# Patient Record
Sex: Male | Born: 1980 | Race: Black or African American | Hispanic: No | Marital: Married | Smoking: Never smoker
Health system: Southern US, Community
[De-identification: ages and names within clinical notes are randomized; demographics above are authoritative.]

## PROBLEM LIST (undated history)

## (undated) DIAGNOSIS — I517 Cardiomegaly: Secondary | ICD-10-CM

## (undated) DIAGNOSIS — R740 Nonspecific elevation of levels of transaminase and lactic acid dehydrogenase [LDH]: Secondary | ICD-10-CM

## (undated) DIAGNOSIS — I213 ST elevation (STEMI) myocardial infarction of unspecified site: Secondary | ICD-10-CM

## (undated) DIAGNOSIS — R030 Elevated blood-pressure reading, without diagnosis of hypertension: Secondary | ICD-10-CM

## (undated) DIAGNOSIS — E669 Obesity, unspecified: Secondary | ICD-10-CM

## (undated) DIAGNOSIS — I309 Acute pericarditis, unspecified: Principal | ICD-10-CM

## (undated) HISTORY — DX: ST elevation (STEMI) myocardial infarction of unspecified site: I21.3

## (undated) HISTORY — DX: Cardiomegaly: I51.7

## (undated) HISTORY — DX: Nonspecific elevation of levels of transaminase and lactic acid dehydrogenase (ldh): R74.0

## (undated) HISTORY — DX: Elevated blood-pressure reading, without diagnosis of hypertension: R03.0

## (undated) HISTORY — PX: CARDIAC CATHETERIZATION: SHX172

## (undated) HISTORY — DX: Acute pericarditis, unspecified: I30.9

---

## 1999-02-21 ENCOUNTER — Encounter: Payer: Self-pay | Admitting: Emergency Medicine

## 1999-02-21 ENCOUNTER — Emergency Department (HOSPITAL_COMMUNITY): Admission: EM | Admit: 1999-02-21 | Discharge: 1999-02-21 | Payer: Self-pay | Admitting: Emergency Medicine

## 2007-09-18 ENCOUNTER — Emergency Department (HOSPITAL_COMMUNITY): Admission: EM | Admit: 2007-09-18 | Discharge: 2007-09-18 | Payer: Self-pay | Admitting: Emergency Medicine

## 2009-04-06 ENCOUNTER — Emergency Department (HOSPITAL_COMMUNITY): Admission: EM | Admit: 2009-04-06 | Discharge: 2009-04-06 | Payer: Self-pay | Admitting: Emergency Medicine

## 2009-06-28 ENCOUNTER — Emergency Department (HOSPITAL_COMMUNITY): Admission: EM | Admit: 2009-06-28 | Discharge: 2009-06-28 | Payer: Self-pay | Admitting: Family Medicine

## 2009-12-17 ENCOUNTER — Emergency Department (HOSPITAL_COMMUNITY): Admission: EM | Admit: 2009-12-17 | Discharge: 2009-12-17 | Payer: Self-pay | Admitting: Emergency Medicine

## 2010-03-13 ENCOUNTER — Emergency Department (HOSPITAL_COMMUNITY): Admission: EM | Admit: 2010-03-13 | Discharge: 2010-03-13 | Payer: Self-pay | Admitting: Emergency Medicine

## 2010-07-02 ENCOUNTER — Emergency Department (HOSPITAL_COMMUNITY): Admission: EM | Admit: 2010-07-02 | Discharge: 2010-07-02 | Payer: Self-pay | Admitting: Family Medicine

## 2010-08-31 ENCOUNTER — Emergency Department (HOSPITAL_COMMUNITY): Admission: EM | Admit: 2010-08-31 | Discharge: 2010-08-31 | Payer: Self-pay | Admitting: Family Medicine

## 2010-11-09 ENCOUNTER — Emergency Department (HOSPITAL_COMMUNITY)
Admission: EM | Admit: 2010-11-09 | Discharge: 2010-11-09 | Payer: Self-pay | Source: Home / Self Care | Admitting: Family Medicine

## 2011-02-20 ENCOUNTER — Inpatient Hospital Stay (INDEPENDENT_AMBULATORY_CARE_PROVIDER_SITE_OTHER)
Admission: RE | Admit: 2011-02-20 | Discharge: 2011-02-20 | Disposition: A | Discharge status: Home / Self Care | Payer: BC Managed Care – PPO | Source: Ambulatory Visit | Attending: Family Medicine | Admitting: Family Medicine

## 2011-02-20 DIAGNOSIS — S335XXA Sprain of ligaments of lumbar spine, initial encounter: Secondary | ICD-10-CM

## 2011-02-20 DIAGNOSIS — IMO0002 Reserved for concepts with insufficient information to code with codable children: Secondary | ICD-10-CM

## 2011-03-11 LAB — POCT I-STAT, CHEM 8
BUN: 13 mg/dL (ref 6–23)
Calcium, Ion: 1.23 mmol/L (ref 1.12–1.32)
Chloride: 103 mEq/L (ref 96–112)
Creatinine, Ser: 1.1 mg/dL (ref 0.4–1.5)
Glucose, Bld: 92 mg/dL (ref 70–99)
HCT: 48 % (ref 39.0–52.0)
Hemoglobin: 16.3 g/dL (ref 13.0–17.0)
Potassium: 3.8 mEq/L (ref 3.5–5.1)
Sodium: 140 mEq/L (ref 135–145)
TCO2: 27 mmol/L (ref 0–100)

## 2011-03-11 LAB — POCT URINALYSIS DIP (DEVICE)
Bilirubin Urine: NEGATIVE
Glucose, UA: NEGATIVE mg/dL
Hgb urine dipstick: NEGATIVE
Ketones, ur: NEGATIVE mg/dL
Nitrite: NEGATIVE
Protein, ur: 30 mg/dL — AB
Specific Gravity, Urine: 1.02 (ref 1.005–1.030)
Urobilinogen, UA: 1 mg/dL (ref 0.0–1.0)
pH: 7 (ref 5.0–8.0)

## 2011-03-11 LAB — CK: Total CK: 302 U/L — ABNORMAL HIGH (ref 7–232)

## 2011-06-24 ENCOUNTER — Emergency Department (HOSPITAL_COMMUNITY)
Admission: EM | Admit: 2011-06-24 | Discharge: 2011-06-24 | Disposition: A | Discharge status: Home / Self Care | Payer: Self-pay | Attending: Emergency Medicine | Admitting: Emergency Medicine

## 2011-06-24 DIAGNOSIS — R112 Nausea with vomiting, unspecified: Secondary | ICD-10-CM | POA: Insufficient documentation

## 2011-06-24 DIAGNOSIS — R4789 Other speech disturbances: Secondary | ICD-10-CM | POA: Insufficient documentation

## 2011-06-24 DIAGNOSIS — R040 Epistaxis: Secondary | ICD-10-CM | POA: Insufficient documentation

## 2011-06-24 DIAGNOSIS — E669 Obesity, unspecified: Secondary | ICD-10-CM | POA: Insufficient documentation

## 2011-06-24 DIAGNOSIS — F1010 Alcohol abuse, uncomplicated: Secondary | ICD-10-CM | POA: Insufficient documentation

## 2011-06-24 LAB — CBC
MCHC: 36.1 g/dL — ABNORMAL HIGH (ref 30.0–36.0)
Platelets: 162 10*3/uL (ref 150–400)
RDW: 12.1 % (ref 11.5–15.5)

## 2011-06-24 LAB — POCT I-STAT, CHEM 8
BUN: 11 mg/dL (ref 6–23)
Calcium, Ion: 1.15 mmol/L (ref 1.12–1.32)
Chloride: 99 mEq/L (ref 96–112)
Creatinine, Ser: 1.5 mg/dL — ABNORMAL HIGH (ref 0.50–1.35)
Glucose, Bld: 127 mg/dL — ABNORMAL HIGH (ref 70–99)
HCT: 42 % (ref 39.0–52.0)
Hemoglobin: 14.3 g/dL (ref 13.0–17.0)
Potassium: 3.8 mEq/L (ref 3.5–5.1)
Sodium: 137 mEq/L (ref 135–145)
TCO2: 25 mmol/L (ref 0–100)

## 2011-06-24 LAB — ETHANOL
Alcohol, Ethyl (B): 241 mg/dL — ABNORMAL HIGH (ref 0–11)
Alcohol, Ethyl (B): 203 mg/dL — ABNORMAL HIGH (ref 0–11)

## 2011-11-21 IMAGING — CR DG CHEST 2V
2 series · 2 of 2 positions shown · non-contrast
Comparison: None.

CLINICAL DATA: Cough, congestion, shortness of breath for 1 week

CHEST - 2 VIEW

[view not recorded (1 of 2)]
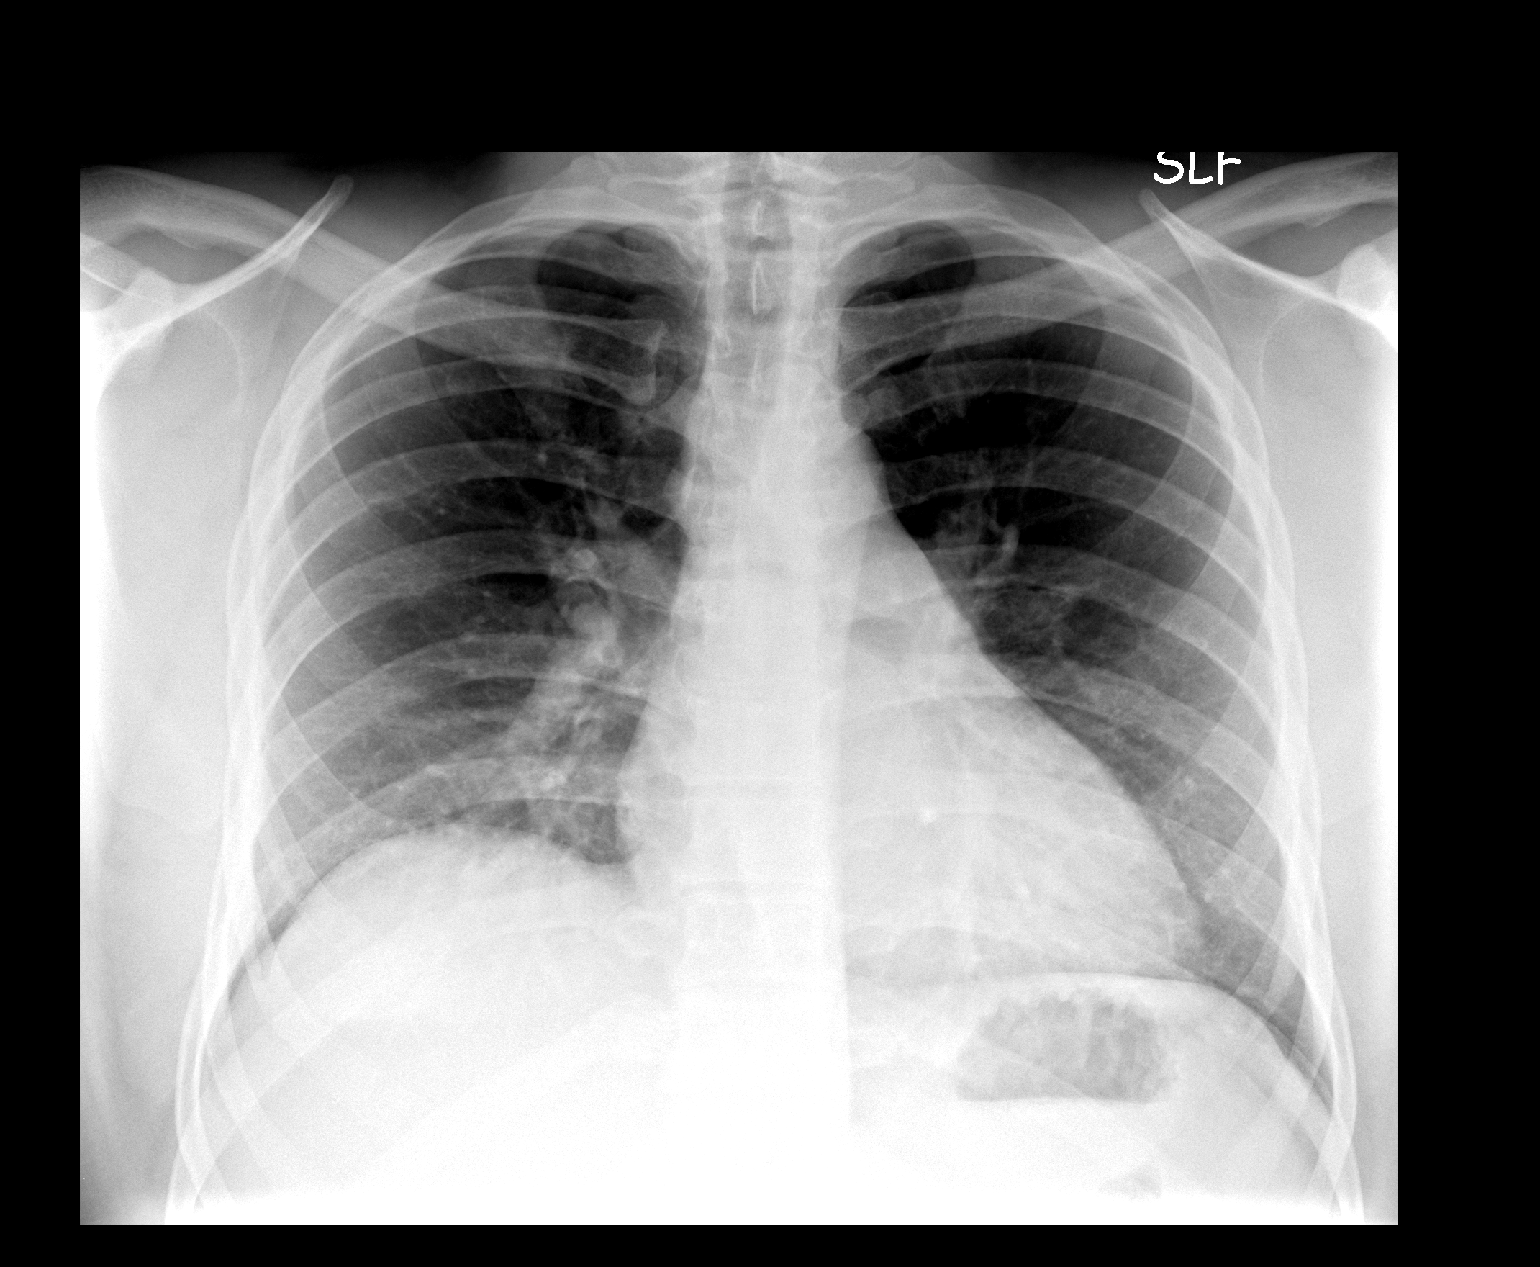

[view not recorded (2 of 2)]
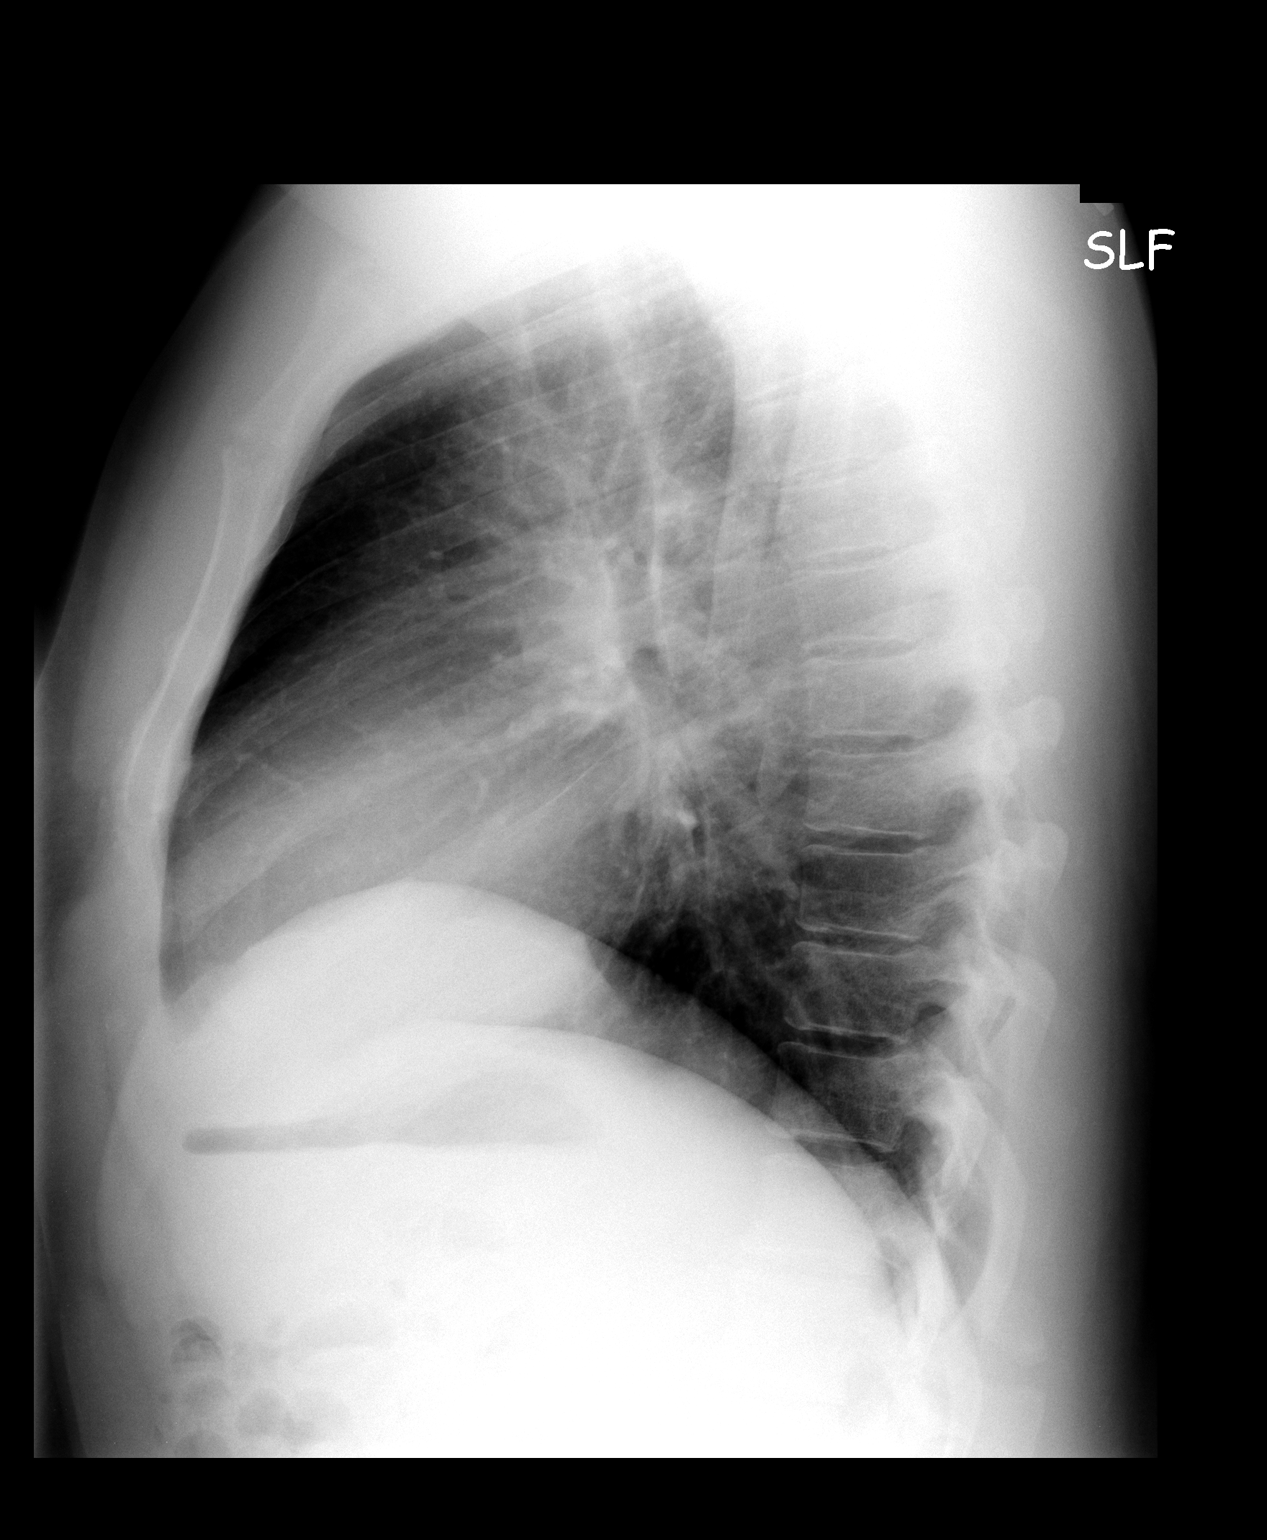

[2 of 2 positions shown; findings below may reference images not displayed]

FINDINGS: The lungs are clear.  Mediastinal contours appear normal.
The heart is within upper limits of normal.  No bony abnormality is
seen.
IMPRESSION: No active lung disease.

## 2012-09-16 ENCOUNTER — Encounter (HOSPITAL_COMMUNITY): Payer: Self-pay | Admitting: *Deleted

## 2012-09-16 ENCOUNTER — Emergency Department (HOSPITAL_COMMUNITY): Payer: BC Managed Care – PPO

## 2012-09-16 DIAGNOSIS — R0602 Shortness of breath: Secondary | ICD-10-CM | POA: Insufficient documentation

## 2012-09-16 DIAGNOSIS — R000 Tachycardia, unspecified: Secondary | ICD-10-CM | POA: Insufficient documentation

## 2012-09-16 DIAGNOSIS — R079 Chest pain, unspecified: Secondary | ICD-10-CM | POA: Insufficient documentation

## 2012-09-16 LAB — CBC WITH DIFFERENTIAL/PLATELET
Basophils Absolute: 0 10*3/uL (ref 0.0–0.1)
Basophils Relative: 0 % (ref 0–1)
HCT: 41.2 % (ref 39.0–52.0)
MCHC: 35.4 g/dL (ref 30.0–36.0)
Monocytes Absolute: 1 10*3/uL (ref 0.1–1.0)
Neutro Abs: 7.4 10*3/uL (ref 1.7–7.7)
RDW: 12.1 % (ref 11.5–15.5)

## 2012-09-16 LAB — COMPREHENSIVE METABOLIC PANEL
AST: 73 U/L — ABNORMAL HIGH (ref 0–37)
Albumin: 4.5 g/dL (ref 3.5–5.2)
Calcium: 9.9 mg/dL (ref 8.4–10.5)
Chloride: 99 mEq/L (ref 96–112)
Creatinine, Ser: 1.14 mg/dL (ref 0.50–1.35)
Total Bilirubin: 3 mg/dL — ABNORMAL HIGH (ref 0.3–1.2)

## 2012-09-16 LAB — POCT I-STAT TROPONIN I

## 2012-09-16 NOTE — ED Notes (Signed)
Pt state that he was diagnosed with inflammation to the cartilage between his ribs a while ago (a year and a half.) Pt states that yesterday he had CP, pt states hard to take a deep breath. Pt states pain constant since yesterday but started to make his teeth hurt so he got worried.

## 2012-09-17 ENCOUNTER — Emergency Department (HOSPITAL_COMMUNITY)
Admission: EM | Admit: 2012-09-17 | Discharge: 2012-09-17 | Disposition: A | Discharge status: Home Health | Payer: BC Managed Care – PPO | Attending: Emergency Medicine | Admitting: Emergency Medicine

## 2012-09-17 ENCOUNTER — Ambulatory Visit (INDEPENDENT_AMBULATORY_CARE_PROVIDER_SITE_OTHER): Payer: BC Managed Care – PPO | Admitting: Internal Medicine

## 2012-09-17 ENCOUNTER — Emergency Department (HOSPITAL_COMMUNITY): Payer: BC Managed Care – PPO

## 2012-09-17 ENCOUNTER — Other Ambulatory Visit: Payer: Self-pay

## 2012-09-17 ENCOUNTER — Encounter: Payer: Self-pay | Admitting: Internal Medicine

## 2012-09-17 ENCOUNTER — Ambulatory Visit (HOSPITAL_BASED_OUTPATIENT_CLINIC_OR_DEPARTMENT_OTHER): Payer: BC Managed Care – PPO | Admitting: Radiology

## 2012-09-17 ENCOUNTER — Encounter (HOSPITAL_COMMUNITY): Payer: Self-pay | Admitting: Radiology

## 2012-09-17 VITALS — BP 138/91 | HR 113 | Ht 70.0 in | Wt 247.4 lb

## 2012-09-17 DIAGNOSIS — I309 Acute pericarditis, unspecified: Secondary | ICD-10-CM

## 2012-09-17 DIAGNOSIS — R072 Precordial pain: Secondary | ICD-10-CM | POA: Insufficient documentation

## 2012-09-17 DIAGNOSIS — G4733 Obstructive sleep apnea (adult) (pediatric): Secondary | ICD-10-CM | POA: Insufficient documentation

## 2012-09-17 DIAGNOSIS — R079 Chest pain, unspecified: Secondary | ICD-10-CM

## 2012-09-17 DIAGNOSIS — I369 Nonrheumatic tricuspid valve disorder, unspecified: Secondary | ICD-10-CM | POA: Insufficient documentation

## 2012-09-17 DIAGNOSIS — R7401 Elevation of levels of liver transaminase levels: Secondary | ICD-10-CM

## 2012-09-17 DIAGNOSIS — IMO0001 Reserved for inherently not codable concepts without codable children: Secondary | ICD-10-CM | POA: Insufficient documentation

## 2012-09-17 DIAGNOSIS — K759 Inflammatory liver disease, unspecified: Secondary | ICD-10-CM

## 2012-09-17 DIAGNOSIS — I517 Cardiomegaly: Secondary | ICD-10-CM

## 2012-09-17 DIAGNOSIS — R030 Elevated blood-pressure reading, without diagnosis of hypertension: Secondary | ICD-10-CM

## 2012-09-17 DIAGNOSIS — I10 Essential (primary) hypertension: Secondary | ICD-10-CM | POA: Insufficient documentation

## 2012-09-17 HISTORY — DX: Acute pericarditis, unspecified: I30.9

## 2012-09-17 HISTORY — DX: Cardiomegaly: I51.7

## 2012-09-17 HISTORY — DX: Reserved for inherently not codable concepts without codable children: IMO0001

## 2012-09-17 HISTORY — DX: Elevation of levels of liver transaminase levels: R74.01

## 2012-09-17 LAB — SEDIMENTATION RATE: Sed Rate: 50 mm/hr — ABNORMAL HIGH (ref 0–22)

## 2012-09-17 MED ORDER — NAPROXEN 500 MG PO TABS: 500.0000 mg | ORAL_TABLET | Freq: Two times a day (BID) | ORAL | Status: DC

## 2012-09-17 MED ORDER — KETOROLAC TROMETHAMINE 30 MG/ML IJ SOLN
30.0000 mg | Freq: Once | INTRAMUSCULAR | Status: AC
  Administered 2012-09-17: 30 mg via INTRAVENOUS
  Filled 2012-09-17: qty 1

## 2012-09-17 MED ORDER — IOHEXOL 350 MG/ML SOLN
100.0000 mL | Freq: Once | INTRAVENOUS | Status: AC | PRN
  Administered 2012-09-17: 100 mL via INTRAVENOUS

## 2012-09-17 NOTE — Assessment & Plan Note (Signed)
The patient has symptoms and ECG consistent with the diagnosis of pericarditis. We'll begin him on Motrin 600 mg 3 times a day. We will plan to see him next week. If symptoms are not improving we can consider colchicine at that time. We will measure a sedimentation rate and CRP to allow [place] to follow this  Going forward. Failure to resolve these increases the likelihood of recurrent pericarditis I have mentioned to him that recurrent and chronic pericarditis are possibilities.

## 2012-09-17 NOTE — ED Provider Notes (Signed)
History     CSN: [id number]  Arrival date & time 09/16/12  2230   First MD Initiated Contact with Patient 09/17/12 0021      Chief Complaint  Patient presents with  . Chest Pain    (Consider location/radiation/quality/duration/timing/severity/associated sxs/prior treatment) HPI Comments: 31 year old male with a history of approximately 24-36 hours of a sharp chest pain which has been persistent, started yesterday, it is worse with position such as leaning forward or leaning back and is not relieved with any specific medicines or positions. He denies any travel, trauma, swelling, injuries, venous thromboembolism and has no family history of any significant cardiac or pulmonary diseases. He drinks occasional alcohol but does not smoke cigarettes or use any drugs. He denies any recent illnesses such as febrile illnesses, cough, cold, diarrhea illnesses. He denies sinus drainage, sore throat, cough, swelling in the legs, rashes, numbness, weakness, diarrhea or dysuria.  Patient is a 31 y.o. male presenting with chest pain. The history is provided by the patient.  Chest Pain     History reviewed. No pertinent past medical history.  History reviewed. No pertinent past surgical history.  History reviewed. No pertinent family history.  History  Substance Use Topics  . Smoking status: Former Games developer  . Smokeless tobacco: Not on file  . Alcohol Use: Yes      Review of Systems  Cardiovascular: Positive for chest pain.  All other systems reviewed and are negative.    Allergies  Review of patient's allergies indicates no known allergies.  Home Medications   Current Outpatient Rx  Name Route Sig Dispense Refill  . THERAFLU FLU/COLD PO Oral Take 1 packet by mouth 2 (two) times daily as needed. For cold/flu symptoms    . NAPROXEN 500 MG PO TABS Oral Take 1 tablet (500 mg total) by mouth 2 (two) times daily with a meal. 30 tablet 0    BP 120/74  Pulse 94  Temp 100.9 F (38.3  C) (Oral)  Resp 30  SpO2 96%  Physical Exam  Nursing note and vitals reviewed. Constitutional: He appears well-developed and well-nourished. No distress.  HENT:  Head: Normocephalic and atraumatic.  Mouth/Throat: Oropharynx is clear and moist. No oropharyngeal exudate.  Eyes: Conjunctivae normal and EOM are normal. Pupils are equal, round, and reactive to light. Right eye exhibits no discharge. Left eye exhibits no discharge. No scleral icterus.  Neck: Normal range of motion. Neck supple. No JVD present. No thyromegaly present.  Cardiovascular: Regular rhythm, normal heart sounds and intact distal pulses.  Exam reveals no gallop and no friction rub.   No murmur heard.      Tachy, 102-105  Pulmonary/Chest: Effort normal and breath sounds normal. No respiratory distress. He has no wheezes. He has no rales.  Abdominal: Soft. Bowel sounds are normal. He exhibits no distension and no mass. There is no tenderness.  Musculoskeletal: Normal range of motion. He exhibits no edema and no tenderness.  Lymphadenopathy:    He has no cervical adenopathy.  Neurological: He is alert. Coordination normal.  Skin: Skin is warm and dry. No rash noted. No erythema.  Psychiatric: He has a normal mood and affect. His behavior is normal.    ED Course  Procedures (including critical care time)  Labs Reviewed  CBC WITH DIFFERENTIAL - Abnormal; Notable for the following:    WBC 10.6 (*)     All other components within normal limits  COMPREHENSIVE METABOLIC PANEL - Abnormal; Notable for the following:    Glucose,  Bld 107 (*)     AST 73 (*)     ALT 108 (*)     Alkaline Phosphatase 190 (*)     Total Bilirubin 3.0 (*)     GFR calc non Af Amer 84 (*)     All other components within normal limits  POCT I-STAT TROPONIN I   Dg Chest 2 View  09/16/2012  *RADIOLOGY REPORT*  Clinical Data: Mid to chest pain and shortness of breath.  CHEST - 2 VIEW  Comparison: 11/09/2010  Findings: Shallow inspiration. The  heart size and pulmonary vascularity are normal. The lungs appear clear and expanded without focal air space disease or consolidation. No blunting of the costophrenic angles.  No pneumothorax.  Mediastinal contours appear intact.  No significant change since previous study.  IMPRESSION: No evidence of active pulmonary disease.   Original Report Authenticated By: Marlon Pel, M.D.    Ct Angio Chest Pe W/cm &/or Wo Cm  09/17/2012  *RADIOLOGY REPORT*  Clinical Data: Low grade fever.  Pleurisy.  Rule out pulmonary embolism.  Tachycardia.  CT ANGIOGRAPHY CHEST  Technique:  Multidetector CT imaging of the chest using the standard protocol during bolus administration of intravenous contrast. Multiplanar reconstructed images including MIPs were obtained and reviewed to evaluate the vascular anatomy.  Contrast: OMNIPAQUE IOHEXOL 350 MG/ML SOLN  Comparison: Plain film of 1 day prior.  No prior CT.  Findings: Lung windows demonstrate minimal motion degradation. Nonspecific bronchial wall thickening to the right lower lobe. Minimal ground-glass opacity at the central right lower lobe is favored to be due to subsegmental atelectasis.  Soft tissue windows:  The quality of this exam for evaluation of pulmonary embolism is moderate.  In addition to mild motion artifact, the bolus is suboptimally timed, with a large amount contrast remaining in the SVC.  No evidence of pulmonary embolism to the large segmental level.  Bovine arch. Normal aortic caliber without dissection.  Mild cardiomegaly.  Trace right greater than left pleural fluid, possibly physiologic.  Upper normal subcarinal nodal tissue 1.1 cm. Mild azygo-esophageal recess adenopathy at 1.1 cm on image 39.  No hilar adenopathy. The lower thoracic esophagus is mildly dilated with a fluid level within.  Residual thymic tissue in the anterior mediastinum.  Limited abdominal imaging demonstrates no significant findings.  No acute osseous abnormality.   IMPRESSION:  1.  Moderate quality exam.  No evidence of pulmonary embolism to the large segmental level. 2.  Mild cardiomegaly with trace bilateral pleural fluid.  This could be physiologic or relate to mild fluid overload. 3.  Prominent mediastinal nodes, favored to be reactive.  Consider follow-up chest CT at 3 - 6 months. 4.  Mildly dilated lower thoracic esophagus.  Question dysmotility or gastroesophageal reflux.   Original Report Authenticated By: Consuello Bossier, M.D.      1. Chest pain       MDM  EKG is abnormal in that he has a sinus tachycardia, there is PR depression in no significant ST elevation, would consider pericarditis, pulmonary embolism, he does have a fever of 100.9 the sodium also consider infectious etiologies of venous trauma embolism.  ED ECG REPORT  I personally interpreted this EKG   Date: 09/17/2012   Rate: 114  Rhythm: sinus tachycardia  QRS Axis: normal  Intervals: normal  ST/T Wave abnormalities: PR depression, nonspecific ST abnormalities  Conduction Disutrbances:none  Narrative Interpretation:   Old EKG Reviewed: No old EKG to compare  I have personally discussed the  plan of care with the cardiologist Dr. Mayford Knife who agrees that the patient can be seen as an outpatient in the next couple of days for followup for what is likely pericarditis. The CT scan of the chest showed no signs of pulmonary embolism, there was mild cardiomegaly with trace bilateral pleural fluid, several prominent mediastinal lymph nodes. There is no significant leukocytosis, no anemia and he does have a transaminitis which the patient states he has had since he was in the [employer] a decade ago. On abdominal exam he has no tenderness. Troponin negative and the patient is unlikely to be cardiac in nature.  The patient states that he will followup as an outpatient with the cardiologist, and encouraged him to return should his symptoms worsen.  Toradol given prior to d/c, pt explained  results and has expressed understanding.  Discharge Prescriptions include:  Naprosyn   Vida Roller, MD 09/17/12 [phone]

## 2012-09-17 NOTE — Assessment & Plan Note (Signed)
As above We'll have to follow the potential deleterious effects of his NSAIDs

## 2012-09-17 NOTE — Assessment & Plan Note (Signed)
Noted on his echo. He has elevated blood pressure today. Blood pressures will need to be followed going forward. Therapy may be indicated. He also has obstructive sleep apnea as. He may well have obstructive sleep apnea and with evidence of left ventricular hypertrophy this should prompt a sleep study in consideration of therapy

## 2012-09-17 NOTE — Patient Instructions (Signed)
Labs today:  CRP, ESR  You have been referred to Dr. Arlyce Dice, Gastroenterology.  Your physician recommends that you schedule a follow-up appointment next week with Tereso Newcomer or Dr. Graciela Husbands.

## 2012-09-17 NOTE — Assessment & Plan Note (Signed)
As above.

## 2012-09-17 NOTE — Assessment & Plan Note (Signed)
I have spoken with Dr. Arlyce Dice. I was concerned about the use of NSAIDs with his liver disease. It was his feeling that his liver disease and not sufficiently severe to warrant back concern but certainly needs evaluation. We'll refer him to GI

## 2012-09-17 NOTE — Progress Notes (Signed)
ELECTROPHYSIOLOGY CONSULT NOTE  Patient ID: Michael [name], MRN: [id number], DOB/AGE: 1981-02-15 31 y.o. Admit date: (Not on file) Date of Consult: 09/17/2012  Primary Physician: Sheila Oats, MD Primary Cardiologist: new   Chief Complaint: chest pain   HPI Michael [name] is a 31 y.o. male referred from the emergency room because of chest pain the possible diagnosis of pericarditis based on the positional changes and significant PR depression.which has been 3-4 days with fever   Pain is positional and pleuriic   He has a long-standing history, apparently, of  Hepatitis i.e. Transaminase.   No past medical history on file.    Surgical History: No past surgical history on file.   Home Meds: Prior to Admission medications   Medication Sig Start Date End Date Taking? Authorizing Provider  Chlorphen-Pseudoephed-APAP (THERAFLU FLU/COLD PO) Take 1 packet by mouth 2 (two) times daily as needed. For cold/flu symptoms    Historical Provider, MD  naproxen (NAPROSYN) 500 MG tablet Take 1 tablet (500 mg total) by mouth 2 (two) times daily with a meal. 09/17/12   Vida Roller, MD    Inpatient Medications:      Allergies: No Known Allergies  History   Social History  . Marital Status: Married    Spouse Name: N/A    Number of Children: N/A  . Years of Education: N/A   Occupational History  . Not on file.   Social History Main Topics  . Smoking status: Former Games developer  . Smokeless tobacco: Not on file  . Alcohol Use: Yes  . Drug Use: No  . Sexually Active:    Other Topics Concern  . Not on file   Social History Narrative  . No narrative on file     No family history on file.   ROS:  Please see the history of present illness.     All other systems reviewed and negative.    Physical Exam:  Blood pressure 138/91, pulse 113, height 5\' 10"  (1.778 m), weight 247 lb 6 oz (112.209 kg). General: Well developed, well nourished male in no acute distress. Head:  Normocephalic, atraumatic, sclera non-icteric, no xanthomas, nares are without discharge. Lymph Nodes:  none Back: without scoliosis/kyphosis , no CVA tendersness Neck: Negative for carotid bruits. JVD not elevated. Lungs: Clear bilaterally to auscultation without wheezes, rales, or rhonchi. Breathing is unlabored. Heart: RRR with S1 S2. No murmur  no, ru heard in any postion, or gallops appreciated. Abdomen: Soft, non-tender, non-distended with normoactive bowel sounds. No hepatomegaly. No rebound/guarding. No obvious abdominal masses. Msk:  Strength and tone appear normal for age. Extremities: No clubbing or cyanosis. No + edema.  Distal pedal pulses are 2+ and equal bilaterally. Skin: Warm and Dry Neuro: Alert and oriented X 3. CN III-XII intact Grossly normal sensory and motor function . Psych:  Responds to questions appropriately with a normal affect.      Labs: Cardiac Enzymes No results found for this basename: CKTOTAL:4,CKMB:4,TROPONINI:4 in the last 72 hours CBC Lab Results  Component Value Date   WBC 10.6* 09/16/2012   HGB 14.6 09/16/2012   HCT 41.2 09/16/2012   MCV 86.2 09/16/2012   PLT 172 09/16/2012   PROTIME: No results found for this basename: LABPROT:3,INR:3 in the last 72 hours Chemistry  Lab 09/16/12 2238  NA 137  K 3.5  CL 99  CO2 28  BUN 10  CREATININE 1.14  CALCIUM 9.9  PROT 8.1  BILITOT 3.0*  ALKPHOS 190*  ALT 108*  AST 73*  GLUCOSE 107*   Lipids No results found for this basename: CHOL, HDL, LDLCALC, TRIG   BNP No results found for this basename: probnp   Miscellaneous No results found for this basename: DDIMER    Radiology/Studies:  Dg Chest 2 View  09/16/2012  *RADIOLOGY REPORT*  Clinical Data: Mid to chest pain and shortness of breath.  CHEST - 2 VIEW  Comparison: 11/09/2010  Findings: Shallow inspiration. The heart size and pulmonary vascularity are normal. The lungs appear clear and expanded without focal air space disease or  consolidation. No blunting of the costophrenic angles.  No pneumothorax.  Mediastinal contours appear intact.  No significant change since previous study.  IMPRESSION: No evidence of active pulmonary disease.   Original Report Authenticated By: Marlon Pel, M.D.    Ct Angio Chest Pe W/cm &/or Wo Cm  09/17/2012  *RADIOLOGY REPORT*  Clinical Data: Low grade fever.  Pleurisy.  Rule out pulmonary embolism.  Tachycardia.  CT ANGIOGRAPHY CHEST  Technique:  Multidetector CT imaging of the chest using the standard protocol during bolus administration of intravenous contrast. Multiplanar reconstructed images including MIPs were obtained and reviewed to evaluate the vascular anatomy.  Contrast: OMNIPAQUE IOHEXOL 350 MG/ML SOLN  Comparison: Plain film of 1 day prior.  No prior CT.  Findings: Lung windows demonstrate minimal motion degradation. Nonspecific bronchial wall thickening to the right lower lobe. Minimal ground-glass opacity at the central right lower lobe is favored to be due to subsegmental atelectasis.  Soft tissue windows:  The quality of this exam for evaluation of pulmonary embolism is moderate.  In addition to mild motion artifact, the bolus is suboptimally timed, with a large amount contrast remaining in the SVC.  No evidence of pulmonary embolism to the large segmental level.  Bovine arch. Normal aortic caliber without dissection.  Mild cardiomegaly.  Trace right greater than left pleural fluid, possibly physiologic.  Upper normal subcarinal nodal tissue 1.1 cm. Mild azygo-esophageal recess adenopathy at 1.1 cm on image 39.  No hilar adenopathy. The lower thoracic esophagus is mildly dilated with a fluid level within.  Residual thymic tissue in the anterior mediastinum.  Limited abdominal imaging demonstrates no significant findings.  No acute osseous abnormality.  IMPRESSION:  1.  Moderate quality exam.  No evidence of pulmonary embolism to the large segmental level. 2.  Mild cardiomegaly  with trace bilateral pleural fluid.  This could be physiologic or relate to mild fluid overload. 3.  Prominent mediastinal nodes, favored to be reactive.  Consider follow-up chest CT at 3 - 6 months. 4.  Mildly dilated lower thoracic esophagus.  Question dysmotility or gastroesophageal reflux.   Original Report Authenticated By: Consuello Bossier, M.D.     EKG:  From last night demonstrated sinus rhythm at a rate of about 110 with ST segment elevation leads 23 V4-V6 and lead 1 and 2 with PR segment depression of 2 mm in PR segment elevation in lead aVR   Assessment and Plan:   Sherryl Manges

## 2012-09-18 ENCOUNTER — Emergency Department (HOSPITAL_COMMUNITY): Payer: BC Managed Care – PPO

## 2012-09-18 ENCOUNTER — Inpatient Hospital Stay (HOSPITAL_COMMUNITY)
Admission: EM | Admit: 2012-09-18 | Discharge: 2012-09-25 | DRG: 543 | Disposition: A | Discharge status: Home / Self Care | Payer: BC Managed Care – PPO | Attending: Cardiovascular Disease | Admitting: Cardiovascular Disease

## 2012-09-18 ENCOUNTER — Encounter (HOSPITAL_COMMUNITY): Payer: Self-pay | Admitting: Emergency Medicine

## 2012-09-18 ENCOUNTER — Observation Stay (HOSPITAL_COMMUNITY): Payer: BC Managed Care – PPO

## 2012-09-18 ENCOUNTER — Other Ambulatory Visit: Payer: Self-pay

## 2012-09-18 DIAGNOSIS — N179 Acute kidney failure, unspecified: Secondary | ICD-10-CM | POA: Diagnosis present

## 2012-09-18 DIAGNOSIS — G4733 Obstructive sleep apnea (adult) (pediatric): Secondary | ICD-10-CM | POA: Diagnosis present

## 2012-09-18 DIAGNOSIS — Z87891 Personal history of nicotine dependence: Secondary | ICD-10-CM

## 2012-09-18 DIAGNOSIS — I517 Cardiomegaly: Secondary | ICD-10-CM

## 2012-09-18 DIAGNOSIS — R7402 Elevation of levels of lactic acid dehydrogenase (LDH): Secondary | ICD-10-CM | POA: Diagnosis present

## 2012-09-18 DIAGNOSIS — R7401 Elevation of levels of liver transaminase levels: Secondary | ICD-10-CM | POA: Diagnosis present

## 2012-09-18 DIAGNOSIS — I10 Essential (primary) hypertension: Secondary | ICD-10-CM | POA: Diagnosis present

## 2012-09-18 DIAGNOSIS — I301 Infective pericarditis: Secondary | ICD-10-CM

## 2012-09-18 DIAGNOSIS — I300 Acute nonspecific idiopathic pericarditis: Principal | ICD-10-CM | POA: Diagnosis present

## 2012-09-18 DIAGNOSIS — I319 Disease of pericardium, unspecified: Secondary | ICD-10-CM

## 2012-09-18 HISTORY — DX: Obesity, unspecified: E66.9

## 2012-09-18 LAB — CBC
HCT: 40.8 % (ref 39.0–52.0)
MCH: 30.3 pg (ref 26.0–34.0)
MCV: 87 fL (ref 78.0–100.0)
Platelets: 213 10*3/uL (ref 150–400)
RBC: 4.69 MIL/uL (ref 4.22–5.81)
RDW: 12.6 % (ref 11.5–15.5)

## 2012-09-18 LAB — CBC WITH DIFFERENTIAL/PLATELET
Basophils Absolute: 0 10*3/uL (ref 0.0–0.1)
Basophils Relative: 0 % (ref 0–1)
Eosinophils Relative: 1 % (ref 0–5)
HCT: 40.7 % (ref 39.0–52.0)
MCHC: 34.9 g/dL (ref 30.0–36.0)
MCV: 87 fL (ref 78.0–100.0)
Monocytes Absolute: 1 10*3/uL (ref 0.1–1.0)
RDW: 12.3 % (ref 11.5–15.5)

## 2012-09-18 LAB — COMPREHENSIVE METABOLIC PANEL
ALT: 130 U/L — ABNORMAL HIGH (ref 0–53)
AST: 87 U/L — ABNORMAL HIGH (ref 0–37)
Albumin: 3.8 g/dL (ref 3.5–5.2)
Alkaline Phosphatase: 253 U/L — ABNORMAL HIGH (ref 39–117)
Potassium: 3.7 mEq/L (ref 3.5–5.1)
Sodium: 134 mEq/L — ABNORMAL LOW (ref 135–145)
Total Protein: 7.8 g/dL (ref 6.0–8.3)

## 2012-09-18 LAB — POCT I-STAT, CHEM 8
Chloride: 100 mEq/L (ref 96–112)
HCT: 44 % (ref 39.0–52.0)
Hemoglobin: 15 g/dL (ref 13.0–17.0)
Potassium: 3.7 mEq/L (ref 3.5–5.1)
Sodium: 138 mEq/L (ref 135–145)

## 2012-09-18 LAB — POCT I-STAT TROPONIN I: Troponin i, poc: 0 ng/mL (ref 0.00–0.08)

## 2012-09-18 LAB — CREATININE, SERUM: GFR calc Af Amer: 54 mL/min — ABNORMAL LOW (ref >90)

## 2012-09-18 MED ORDER — COLCHICINE 0.6 MG PO TABS
0.6000 mg | ORAL_TABLET | Freq: Every day | ORAL | Status: AC
  Administered 2012-09-19: 0.6 mg via ORAL
  Filled 2012-09-18: qty 1

## 2012-09-18 MED ORDER — IBUPROFEN 200 MG PO TABS
200.0000 mg | ORAL_TABLET | Freq: Three times a day (TID) | ORAL | Status: DC
  Administered 2012-09-18: 200 mg via ORAL
  Filled 2012-09-18 (×4): qty 1

## 2012-09-18 MED ORDER — PANTOPRAZOLE SODIUM 40 MG PO TBEC
40.0000 mg | DELAYED_RELEASE_TABLET | Freq: Every day | ORAL | Status: DC
  Administered 2012-09-18 – 2012-09-22 (×5): 40 mg via ORAL
  Filled 2012-09-18 (×6): qty 1

## 2012-09-18 MED ORDER — OXYCODONE-ACETAMINOPHEN 5-325 MG PO TABS
1.0000 | ORAL_TABLET | Freq: Once | ORAL | Status: AC
  Administered 2012-09-18: 1 via ORAL
  Filled 2012-09-18: qty 1

## 2012-09-18 MED ORDER — SODIUM CHLORIDE 0.9 % IJ SOLN
3.0000 mL | INTRAMUSCULAR | Status: DC | PRN
  Administered 2012-09-24: 3 mL via INTRAVENOUS

## 2012-09-18 MED ORDER — SODIUM CHLORIDE 0.9 % IV SOLN
INTRAVENOUS | Status: DC
  Administered 2012-09-18 – 2012-09-20 (×4): via INTRAVENOUS

## 2012-09-18 MED ORDER — ONDANSETRON HCL 4 MG/2ML IJ SOLN: 4.0000 mg | Freq: Four times a day (QID) | INTRAMUSCULAR | Status: DC | PRN

## 2012-09-18 MED ORDER — MORPHINE SULFATE 4 MG/ML IJ SOLN
4.0000 mg | Freq: Once | INTRAMUSCULAR | Status: AC
  Administered 2012-09-18: 4 mg via INTRAVENOUS
  Filled 2012-09-18: qty 1

## 2012-09-18 MED ORDER — ACETAMINOPHEN 325 MG PO TABS
650.0000 mg | ORAL_TABLET | Freq: Once | ORAL | Status: AC
  Administered 2012-09-18: 650 mg via ORAL
  Filled 2012-09-18: qty 2

## 2012-09-18 MED ORDER — SODIUM CHLORIDE 0.9 % IV SOLN: 250.0000 mL | INTRAVENOUS | Status: DC | PRN

## 2012-09-18 MED ORDER — OXYCODONE-ACETAMINOPHEN 5-325 MG PO TABS
1.0000 | ORAL_TABLET | Freq: Three times a day (TID) | ORAL | Status: DC
  Administered 2012-09-18 – 2012-09-21 (×8): 2 via ORAL
  Administered 2012-09-21: 1 via ORAL
  Administered 2012-09-21 – 2012-09-23 (×6): 2 via ORAL
  Administered 2012-09-23: 1 via ORAL
  Administered 2012-09-24 – 2012-09-25 (×4): 2 via ORAL
  Administered 2012-09-25: 1 via ORAL
  Filled 2012-09-18: qty 1
  Filled 2012-09-18 (×20): qty 2

## 2012-09-18 MED ORDER — COLCHICINE 0.6 MG PO TABS
0.6000 mg | ORAL_TABLET | Freq: Two times a day (BID) | ORAL | Status: DC
  Administered 2012-09-18: 0.6 mg via ORAL
  Filled 2012-09-18 (×2): qty 1

## 2012-09-18 MED ORDER — FENTANYL CITRATE 0.05 MG/ML IJ SOLN: 25.0000 ug | INTRAMUSCULAR | Status: DC | PRN

## 2012-09-18 MED ORDER — ZOLPIDEM TARTRATE 5 MG PO TABS: 5.0000 mg | ORAL_TABLET | Freq: Every evening | ORAL | Status: DC | PRN

## 2012-09-18 MED ORDER — ONDANSETRON HCL 4 MG/2ML IJ SOLN
4.0000 mg | Freq: Once | INTRAMUSCULAR | Status: AC
  Administered 2012-09-18: 4 mg via INTRAVENOUS
  Filled 2012-09-18: qty 2

## 2012-09-18 MED ORDER — IBUPROFEN 600 MG PO TABS
600.0000 mg | ORAL_TABLET | Freq: Three times a day (TID) | ORAL | Status: DC
  Administered 2012-09-18 (×2): 600 mg via ORAL
  Filled 2012-09-18: qty 3
  Filled 2012-09-18: qty 1

## 2012-09-18 MED ORDER — ENOXAPARIN SODIUM 40 MG/0.4ML ~~LOC~~ SOLN
40.0000 mg | SUBCUTANEOUS | Status: DC
  Administered 2012-09-18 – 2012-09-21 (×4): 40 mg via SUBCUTANEOUS
  Filled 2012-09-18 (×6): qty 0.4

## 2012-09-18 MED ORDER — SODIUM CHLORIDE 0.9 % IJ SOLN
3.0000 mL | Freq: Two times a day (BID) | INTRAMUSCULAR | Status: DC
  Administered 2012-09-19 – 2012-09-25 (×10): 3 mL via INTRAVENOUS

## 2012-09-18 MED ORDER — ALPRAZOLAM 0.25 MG PO TABS
0.2500 mg | ORAL_TABLET | Freq: Two times a day (BID) | ORAL | Status: DC | PRN
  Administered 2012-09-21: 0.25 mg via ORAL
  Filled 2012-09-18: qty 1

## 2012-09-18 NOTE — Progress Notes (Addendum)
Labs noted that were drawn this evening for baseline enoxaparin. Cr elevated to 1.86 and WBC 19.1. After discussion with Dr. Elease Hashimoto, the following actions were ordered: - decreased motrin to 200mg  TID - repeat BMET in AM - hydration at 75cc/hr NS for now - blood cultures x 2 - procalcitonin - influenza panel - strep pneumo urinary antigen  Please note colchicine order was previously entered to be continued for 1 day. If this is to continue tomorrow, please reorder.  Lucianna Ostlund PA-C

## 2012-09-18 NOTE — ED Provider Notes (Signed)
Cardiology has seen the patient based on their note, they plan to observe until noon.  Will move to CDU and they can reassess at noon for dispo.  D/w Trixie Dredge, PA  Ethelda Chick, MD 09/18/12 [phone]

## 2012-09-18 NOTE — ED Notes (Signed)
Pt returned from ultrasound

## 2012-09-18 NOTE — ED Notes (Signed)
Pt on stretcher, per cardiologist, ekg may show worsening pericarditis, pt complains of pain and sob since Monday, seen here on Tuesday morning and dx with pericarditis.

## 2012-09-18 NOTE — ED Notes (Signed)
To x-ray

## 2012-09-18 NOTE — ED Provider Notes (Addendum)
History     CSN: [id number]  Arrival date & time 09/18/12  4098   First MD Initiated Contact with Patient 09/18/12 0543      Chief Complaint  Patient presents with  . Chest Pain    (Consider location/radiation/quality/duration/timing/severity/associated sxs/prior treatment) HPI Comments: Patient was seen in ER 2 days ago and had a CT that was negative for PE and was referred to cardiology. He saw cardiology yesterday who diagnosed him with acute pericarditis. He was started on ibuprofen and had a normal echo in the office. He was to followup in one week for reevaluation. However he developed worsening pain and shortness of breath tonight and came in.  Patient is a 31 y.o. male presenting with chest pain. The history is provided by the patient.  Chest Pain The chest pain began 3 - 5 days ago. Duration of episode(s) is 3 days. Chest pain occurs constantly. The chest pain is worsening. At its most intense, the pain is at 8/10. The pain is currently at 8/10. The severity of the pain is severe. The quality of the pain is described as pressure-like and sharp. The pain does not radiate. Chest pain is worsened by certain positions (worse with lying down). Primary symptoms include a fever and shortness of breath. Pertinent negatives for primary symptoms include no cough, no wheezing, no nausea and no vomiting.  Pertinent negatives for associated symptoms include no diaphoresis. Risk factors include no known risk factors. Past medical history comments: pericarditis     Past Medical History  Diagnosis Date  . Obstructive sleep apnea 09/17/2012  . Elevated transaminase level/Chronic 09/17/2012  . Elevated blood pressure 09/17/2012  . Left ventricular hypertrophy 09/17/2012  . Acute pericarditis, unspecified 09/17/2012    History reviewed. No pertinent past surgical history.  No family history on file.  History  Substance Use Topics  . Smoking status: Former Games developer  . Smokeless tobacco:  Not on file  . Alcohol Use: Yes      Review of Systems  Constitutional: Positive for fever. Negative for diaphoresis.  Respiratory: Positive for shortness of breath. Negative for cough and wheezing.   Cardiovascular: Positive for chest pain.  Gastrointestinal: Negative for nausea and vomiting.  All other systems reviewed and are negative.    Allergies  Review of patient's allergies indicates no known allergies.  Home Medications  No current outpatient prescriptions on file.  BP 115/70  Pulse 81  Temp 98.7 F (37.1 C) (Oral)  Resp 24  SpO2 100%  Physical Exam  Nursing note and vitals reviewed. Constitutional: He is oriented to person, place, and time. He appears well-developed and well-nourished. He appears distressed.  HENT:  Head: Normocephalic and atraumatic.  Mouth/Throat: Oropharynx is clear and moist.  Eyes: Conjunctivae normal and EOM are normal. Pupils are equal, round, and reactive to light.  Neck: Normal range of motion. Neck supple.  Cardiovascular: Normal rate, regular rhythm and intact distal pulses.   No murmur heard. Pulmonary/Chest: Breath sounds normal. Tachypnea noted. No respiratory distress. He has no wheezes. He has no rales.  Abdominal: Soft. He exhibits no distension. There is no tenderness. There is no rebound and no guarding.  Musculoskeletal: Normal range of motion. He exhibits no edema and no tenderness.  Neurological: He is alert and oriented to person, place, and time.  Skin: Skin is warm and dry. No rash noted. No erythema.  Psychiatric: He has a normal mood and affect. His behavior is normal.    ED Course  Procedures (including  critical care time)  Labs Reviewed  CBC WITH DIFFERENTIAL - Abnormal; Notable for the following:    WBC 11.3 (*)     Neutro Abs 8.7 (*)     All other components within normal limits  POCT I-STAT, CHEM 8 - Abnormal; Notable for the following:    Glucose, Bld 163 (*)     All other components within normal  limits  POCT I-STAT TROPONIN I  COMPREHENSIVE METABOLIC PANEL   Dg Chest 2 View  09/16/2012  *RADIOLOGY REPORT*  Clinical Data: Mid to chest pain and shortness of breath.  CHEST - 2 VIEW  Comparison: 11/09/2010  Findings: Shallow inspiration. The heart size and pulmonary vascularity are normal. The lungs appear clear and expanded without focal air space disease or consolidation. No blunting of the costophrenic angles.  No pneumothorax.  Mediastinal contours appear intact.  No significant change since previous study.  IMPRESSION: No evidence of active pulmonary disease.   Original Report Authenticated By: Marlon Pel, M.D.    Ct Angio Chest Pe W/cm &/or Wo Cm  09/17/2012  *RADIOLOGY REPORT*  Clinical Data: Low grade fever.  Pleurisy.  Rule out pulmonary embolism.  Tachycardia.  CT ANGIOGRAPHY CHEST  Technique:  Multidetector CT imaging of the chest using the standard protocol during bolus administration of intravenous contrast. Multiplanar reconstructed images including MIPs were obtained and reviewed to evaluate the vascular anatomy.  Contrast: OMNIPAQUE IOHEXOL 350 MG/ML SOLN  Comparison: Plain film of 1 day prior.  No prior CT.  Findings: Lung windows demonstrate minimal motion degradation. Nonspecific bronchial wall thickening to the right lower lobe. Minimal ground-glass opacity at the central right lower lobe is favored to be due to subsegmental atelectasis.  Soft tissue windows:  The quality of this exam for evaluation of pulmonary embolism is moderate.  In addition to mild motion artifact, the bolus is suboptimally timed, with a large amount contrast remaining in the SVC.  No evidence of pulmonary embolism to the large segmental level.  Bovine arch. Normal aortic caliber without dissection.  Mild cardiomegaly.  Trace right greater than left pleural fluid, possibly physiologic.  Upper normal subcarinal nodal tissue 1.1 cm. Mild azygo-esophageal recess adenopathy at 1.1 cm on image 39.   No hilar adenopathy. The lower thoracic esophagus is mildly dilated with a fluid level within.  Residual thymic tissue in the anterior mediastinum.  Limited abdominal imaging demonstrates no significant findings.  No acute osseous abnormality.  IMPRESSION:  1.  Moderate quality exam.  No evidence of pulmonary embolism to the large segmental level. 2.  Mild cardiomegaly with trace bilateral pleural fluid.  This could be physiologic or relate to mild fluid overload. 3.  Prominent mediastinal nodes, favored to be reactive.  Consider follow-up chest CT at 3 - 6 months. 4.  Mildly dilated lower thoracic esophagus.  Question dysmotility or gastroesophageal reflux.   Original Report Authenticated By: Consuello Bossier, M.D.    Dg Chest Port 1 View  09/18/2012  *RADIOLOGY REPORT*  Clinical Data: Shortness of breath and chest pain.  PORTABLE CHEST - 1 VIEW  Comparison: 09/16/2012  Findings: Shallow inspiration.  Increased density in the right lung base since previous study probably represents atelectasis. Pneumonia not excluded.  Heart size and pulmonary vascularity are normal for technique.  No blunting of costophrenic angles.  No pneumothorax.  Mediastinal contours appear intact.  IMPRESSION: Shallow inspiration with infiltration or atelectasis in the right lung base, new since previous study.   Original Report Authenticated By:  Marlon Pel, M.D.     Date: 09/18/2012  Rate: 85  Rhythm: normal sinus rhythm  QRS Axis: normal  Intervals: normal  ST/T Wave abnormalities: ST elevations inferiorly and ST elevations laterally  Conduction Disutrbances:none  Narrative Interpretation:   Old EKG Reviewed: more pronounced ST elevation laterally today compared to 2 days ago.    1. Pericarditis       MDM   Patient presented with worsening chest pain with lying down and shortness of breath. He was seen in ER 2 days ago and sent to cardiology yesterday who diagnosed him with acute pericarditis. He was started  on ibuprofen and was to start colchicine.  He had an episode in the office yesterday which showed no signs of pericardial effusion and he did have some mild LVH.  Because he felt more short of breath and his pain was worse he came back in today. Initially the EKG was read as acute stenting however after further information given by the patient and a prior records the STEMI was canceled. Cardiology also reviewed the EKG findings and agreed with canceling the STEMI. Closer evaluation of the EKG the patient has diffuse ST elevation but it is more pronounced in the infero-lateral leads. Patient's vital signs are stable. He was given pain control and CBC, CMP, troponin, chest x-ray pending.  6:13 AM Pt with unchanged labs however CXR with possible new infiltration however poor quality film.  On re-eval pt's pain is better but still severe pleuritic chest pain with deep breaths.  Will give another dose of morphine and get AP/lat CXR for better eval.  6:53 AM Cardiology will come to evaluate.  May start colchicine.  Will attempt to pain control.  Pt received 2nd dose of morphine.    Gwyneth Sprout, MD 09/18/12 5284  Gwyneth Sprout, MD 09/18/12 1324  Gwyneth Sprout, MD 09/18/12 4010  Gwyneth Sprout, MD 09/18/12 2725

## 2012-09-18 NOTE — ED Notes (Signed)
MD at bedside. 

## 2012-09-18 NOTE — Consult Note (Signed)
[facility] Consult: 4:17 PM 09/18/2012   Referring Provider: Dr. Elease Hashimoto  Primary Care Physician:  He has no primary care physician Primary Gastroenterologist:  He does not have a gastroenterologist  Reason for Consultation:  Long history of abnormal LFTs, dating back to his teenage years.  HPI: Michael [name] is a 31 y.o. male. He has a history of hypertension,  left ventricular hypertrophy and abnormal LFTs.  Patient was referred from the emergency room to office visit with Dr. Berton Mount of cardiology on 09/17/12. Reason for the referral was possible pericarditis based on symptomatology of fever, pleuritic chest pain exacerbated by positional changes and  PR interval depression.   Patient also has long-standing history of abnormal liver tests.  This was first noted when patient attempted to donate blood in his late teens.  It persisted through his time in the [employer]. At one point, when he was based in [place], liver biopsy was entertained but ultimately canceled. He has tested negative for viral hepatitis serologies.  He is not sure if he has been vaccinated for hepatitis A or B but thinks that he has been vaccinated. He says he drinks 12 to 24 oz beer daily, has never been a heavy drinker, does not use much Acetominophen, has had no exposure to IV ilicit drugs.   Patient came back to the emergency room on 09/18/2012 because a worsening pain in the chest which is aggravated by inspiration and alleviated by leaning forward. The 600 milligram dose of ibuprofen was ineffective in controlling the patient's pain.  Repeat EKG shows no change in diffuse PR interval depression. Initially a code STEMI was called but subsequently canceled after serial EKGs were compared to those of a few days ago and detailed history was obtained. Initial troponin I was normal. He does have a fever of 100.4.  He also has anorexia and experiences nausea when he moves  suddenly. These GI symptoms are new. Patient denies prior history of jaundice. His urine is not dark. He has no pruritus. He has no unusual or excessive bleeding or bruising.  Patient denies occupational exposure to toxic chemicals. 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  For a period of time he flew out of [employer] in [place]. He retired from [employer] 6 years ago.  An ultrasound of the abdomen has been obtained 10/17 and it is normal. LFTs reviewed with labs of October 15 compared to those of October 17.  Total bilirubin has gone from 3 to 4.3, alkaline phosphatase has gone from 190 to 253, AST is gone from 73 to 87, ALT has gone from 108 to130.   Past Medical History  Diagnosis Date  .    Marland Kitchen Elevated transaminase level/Chronic 09/17/2012  . Elevated blood pressure 09/17/2012  . Left ventricular hypertrophy 09/17/2012  . Acute pericarditis, unspecified 09/17/2012    History reviewed. No pertinent past surgical history.  Prior to Admission medications   Medication Sig Start Date End Date Taking? Authorizing Provider  ibuprofen (ADVIL,MOTRIN) 200 MG tablet Take 600 mg by mouth every 6 (six) hours as needed. For pain   Yes Historical Provider, MD    Scheduled Meds:    . acetaminophen  650 mg Oral Once  . colchicine  0.6 mg Oral BID  . ibuprofen  600 mg Oral TID  .  morphine injection  4 mg Intravenous Once  .  morphine injection  4 mg Intravenous Once  . ondansetron  4  mg Intravenous Once  . oxyCODONE-acetaminophen  1 tablet Oral Once  . oxyCODONE-acetaminophen  1 tablet Oral Once  . oxyCODONE-acetaminophen  1-2 tablet Oral Q8H  . pantoprazole  40 mg Oral Q0600   Infusions:   PRN Meds: fentaNYL   Allergies as of 09/18/2012  . (No Known Allergies)    Family history His mother, present in the room, states that she has a long history of abnormal liver enzymes. This abnormality precedes her having been started on cholesterol lowering meds. She has  never undergone liver biopsy  History   Social History  . Marital Status: Married    Spouse Name: N/A    Number of Children: N/A  . Years of Education: N/A   Occupational History  . Works as a Data processing manager   Social History Main Topics  . Smoking status: Former Games developer  . Smokeless tobacco: Not on file  . Alcohol Use: Yes. drinks about one to 2 beers daily  . Drug Use: Not on file  . Sexually Active:      REVIEW OF SYSTEMS: Constitutional:  Weight is stable ENT:  Nosebleeds, no congestion Pulm:  No shortness of breath however he is taking shallow breaths because of the pleuritic pain CV:  The pain he is experiencing begins in the mid-to lower sternum and radiates across to the right lower ribs. Eating does not exacerbate the pain GU:  No hematuria, no frequency GI:  No dysphagia, heartburn Heme:  History of anemia,.    Transfusions:  No history of transfusions Neuro:  No headache, no dizziness, no paresthesias, no seizures Derm:  No rash, sores, itching Endocrine:  No history of thyroid or pancreatic disease. Immunization:  Patient received influenza vaccine on 08/03/2012 Travel:  None recently Tattoos:  His first tattoo was at age 74 which would be 1999, last tattoo was in 2011.  All tattoos performed in a professional tattoo parlor observing anti-septic practices   PHYSICAL EXAM: Vital signs in last 24 hours: Temp:  [97.8 F (36.6 C)-100.4 F (38 C)] 98.7 F (37.1 C) (10/17 1608) Pulse Rate:  [77-108] 97  (10/17 1608) Resp:  [5-96] 80  (10/17 1608) BP: (94-120)/(45-87) 114/76 mmHg (10/17 1608) SpO2:  [94 %-100 %] 100 % (10/17 1608) Weight:  [112.22 kg (247 lb 6.4 oz)] 112.22 kg (247 lb 6.4 oz) (10/17 1608) BMI is 35.6  General: somewhat drowsy but well appearing, overweigh, African American man. He did recently take Percocet Head:  No facial asymmetry or swelling Eyes:  Slight scleral icterus Ears:  No hearing deficits  Nose:  No nasal discharge or  congestion Mouth:  Excellent dentition, pink and moist as well as clear oral mucous membranes. Tongue is midline Neck:  No masses, thyromegaly or JVD Lungs:  Respirations shallow due to pain, clear bilaterally to auscultation without wheezes, rales, or rhonchi.  Heart: Tachycardic, regular, with S1 S2. No murmurs, rubs, or gallops appreciated. Abdomen:  Soft, nondistended, nontender. No masses, no HSM bruits..   Rectal: for   Musc/Skeltl: no gross joint deformities, contractures or erythema Extremities:  No pedal edema  Neurologic:  Alert and oriented x3. Drowsy following recent administration of Percocet. No tremors, no gross limb weakness Skin: no sores, no rash, no obvious jaundice  Tattoos:  Several tattoos on arms Nodes:  No inguinal or cervical adenopathy   Psych:  Pleasant, cooperative, affect is depressed however he did just take a Percocet  LAB RESULTS:  Basename 09/18/12 0551 09/18/12 0535 09/16/12 2238  WBC --  11.3* 10.6*  HGB 15.0 14.2 14.6  HCT 44.0 40.7 41.2  PLT -- 175 172   BMET Lab Results  Component Value Date   NA 138 09/18/2012   NA 134* 09/18/2012   NA 137 09/16/2012   K 3.7 09/18/2012   K 3.7 09/18/2012   K 3.5 09/16/2012   CL 100 09/18/2012   CL 97 09/18/2012   CL 99 09/16/2012   CO2 27 09/18/2012   CO2 28 09/16/2012   GLUCOSE 163* 09/18/2012   GLUCOSE 171* 09/18/2012   GLUCOSE 107* 09/16/2012   BUN 12 09/18/2012   BUN 13 09/18/2012   BUN 10 09/16/2012   CREATININE 1.30 09/18/2012   CREATININE 1.21 09/18/2012   CREATININE 1.14 09/16/2012   CALCIUM 9.7 09/18/2012   CALCIUM 9.9 09/16/2012   LFT  Basename 09/18/12 0535 09/16/12 2238  PROT 7.8 8.1  ALBUMIN 3.8 4.5  AST 87* 73*  ALT 130* 108*  ALKPHOS 253* 190*  BILITOT 4.3* 3.0*  BILIDIR -- --  IBILI -- --   PT/INR No results found for this basename: INR, PROTIME   Hepatitis Panel An acute hepatitis panel has been ordered and is in process  Drugs of Abuse  No results found for  this basename: labopia, cocainscrnur, labbenz, amphetmu, thcu, labbarb     RADIOLOGY STUDIES:  Ct Angio Chest Pe W/cm &/or Wo Cm 09/17/2012  Findings: Lung windows demonstrate minimal motion degradation. Nonspecific bronchial wall thickening to the right lower lobe. Minimal ground-glass opacity at the central right lower lobe is favored to be due to subsegmental atelectasis.  Soft tissue windows:  The quality of this exam for evaluation of pulmonary embolism is moderate.  In addition to mild motion artifact, the bolus is suboptimally timed, with a large amount contrast remaining in the SVC.  No evidence of pulmonary embolism to the large segmental level.  Bovine arch. Normal aortic caliber without dissection.  Mild cardiomegaly.  Trace right greater than left pleural fluid, possibly physiologic.  Upper normal subcarinal nodal tissue 1.1 cm. Mild azygo-esophageal recess adenopathy at 1.1 cm on image 39.  No hilar adenopathy. The lower thoracic esophagus is mildly dilated with a fluid level within.  Residual thymic tissue in the anterior mediastinum.  Limited abdominal imaging demonstrates no significant findings.  No acute osseous abnormality.  IMPRESSION: 1.  Moderate quality exam.  No evidence of pulmonary embolism to the large segmental level. 2.  Mild cardiomegaly with trace bilateral pleural fluid.  This could be physiologic or relate to mild fluid overload. 3.  Prominent mediastinal nodes, favored to be reactive.  Consider follow-up chest CT at 3 - 6 months. 4.  Mildly dilated lower thoracic esophagus.  Question dysmotility or gastroesophageal reflux.    Original Report Authenticated By: Consuello Bossier, M.D.    US Abdomen Complete 09/18/2012  Findings:  Gallbladder:  No stones or wall thickening.  Negative sonographic Murphy's.  Common bile duct:   Normal caliber, 3 mm.  Liver:  No focal lesion identified.  Within normal limits in parenchymal echogenicity.  IVC:  Appears normal.  Pancreas:  No focal  abnormality seen.  Spleen:  Within normal limits in size and echotexture.  Right Kidney:   Normal in size and parenchymal echogenicity.  No evidence of mass or hydronephrosis.  Left Kidney:  Normal in size and parenchymal echogenicity.  No evidence of mass or hydronephrosis.  Abdominal aorta:  No aneurysm identified.  Small bilateral pleural effusions noted.  IMPRESSION: No acute findings.  Original Report Authenticated By: Cyndie Chime, M.D.    Dg Chest Port 1 View 09/18/2012    Findings: Shallow inspiration.  Increased density in the right lung base since previous study probably represents atelectasis. Pneumonia not excluded.  Heart size and pulmonary vascularity are normal for technique.  No blunting of costophrenic angles.  No pneumothorax.  Mediastinal contours appear intact.  IMPRESSION: Shallow inspiration with infiltration or atelectasis in the right lung base, new since previous study.   Original Report Authenticated By: Marlon Pel, M.D.     ENDOSCOPIC STUDIES: None of her  IMPRESSION: *  Long history of abnormal LFTs of undetermined etiology dating back to his late teen years. By ultrasound the biliary tract and liver are unremarkable. *  Acute pericarditis *  Dilated lower esophagus as imaged on CT scan 10/16.  Patient has no symptoms corresponding with a diagnosis of dysmotility or GERD.  PLAN: *  Per Dr. Christella Hartigan.   LOS: 0 days   Jennye Moccasin  09/18/2012, 4:17 PM Pager: [phone]    Basename 09/18/12 1815 09/18/12 0551 09/18/12 0535 09/16/12 2238  WBC 19.1* -- 11.3* 10.6*  HGB 14.2 15.0 14.2 --  PLT 213 -- 175 172  MCV 87.0 -- 87.0 86.2    Basename 09/19/12 0520 09/18/12 1815 09/18/12 0551 09/18/12 0535 09/16/12 2238  NA 134* -- 138 134* --  K 4.3 -- 3.7 3.7 --  CL 96 -- 100 97 --  CO2 24 -- -- 27 28  GLUCOSE 123* -- 163* 171* --  BUN 25* -- 12 13 --  CREATININE 2.36* 1.86* 1.30 -- --  CALCIUM 8.9 -- -- 9.7 9.9    Basename 09/19/12 0520 09/18/12 0535  09/16/12 2238  PROT 7.0 7.8 8.1  ALBUMIN 3.3* 3.8 4.5  AST 801* 87* 73*  ALT 635* 130* 108*  ALKPHOS 210* 253* 190*  BILITOT 5.5* 4.3* 3.0*  BILIDIR 3.0* -- --  IBILI 2.5* -- --     Assessment/Plan: 31 y.o. male with acute pericaditis, rapidly elevating liver tests, increasing creatinine  He has a long history of lft elevations but current status is something new.  transaminses signficantly increased over past 3 days. NSAIDs, just recently started, can cause this and have actually been stopped now that colchicine has been started. Colchicine can also cause hepatotoxicity but has different mechanism of action that NSAIDs.  Acute viral Hepatitis virus' uncommonly can cause acute pericarditis.  Awaiting viral hepatitis panel. Also CMV can cause both hepatitis and pericarditis. Will check for that as well.  Furthermore, testing for other usual causes of lft elevation.  US shows no gallstones and no biliary dilation.  Will recheck LFTs, inr in AM. Dr. Elnoria Howard is covering [facility] this weekend.    Rob Bunting, MD  09/19/2012, 11:42 AM [facility] Pager [phone]

## 2012-09-18 NOTE — ED Notes (Signed)
Code stemi paged out

## 2012-09-18 NOTE — H&P (Signed)
Cardiology Consult Note   [name] ID: Michael [name] MRN: [id number], DOB/AGE: 12/22/1980   Admit date: 09/18/2012 Date of Consult: 09/18/2012  Primary Physician: Michael Oats, MD Primary Cardiologist: seen in Michael office yesterday by Dr. 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 Michael [name] was seen in ED on 10/15 for chest pain. CT revealed neg PE. Michael [name] was dxed with acute pericarditis.  Michael [name] followed up with Dr. Graciela [name] yesterday. Michael [name] noted pleuritic chest pain with positional changes and 3-4 days of fevers, chills and malaise. EKG revealed significant PR depression. HPI and EKG changes confirmed acute pericarditis. Michael [name] was started on Motrin 600mg  TID with plans for follow-up next week. Note was made to start colchicine at that time if symptoms were not improving. Inflammatory markers were drawn with aim to follow to resolution. ESR elevated at 50. CRP WNL. Echo performed revealed LVEF 65-70%, moderate LVH, no WMAs, mild RA/RV dilatation. No effusion/tamponade. BP control was stressed, formal sleep study recommended, and formal GI consult for chronic elevated transaminitis.   Michael pain has since worsened (7/10 yesterday, 8/10 today) thus prompting return to ED. Michael [name] reports aggravation on inspiration and alleviation sitting forward. Here, EKG reveals diffuse unchanged PR depression. STs appear elevated relative to PR depressions. Code STEMI was initially called to Michael overnight fellow, but was subsequently canceled after more history and comparison of EKGs were elicited. Initial trop-I WNL. CMET reveals a mild hyponatremia at 134, elevated alk phos at 253, AST 87, ALT 130. CBC with leukocytosis at 11.3. Pt febrile at 100.4. VS otherwise stable. CXR reveals  cardiomegaly, shallow inspiration with infiltration vs atelectasis in lung bases.   Problem List: Past Medical History  Diagnosis Date  . Obstructive sleep apnea 09/17/2012  . Elevated transaminase level/Chronic 09/17/2012  . Elevated blood pressure 09/17/2012  . Left ventricular hypertrophy 09/17/2012  . Acute pericarditis, unspecified 09/17/2012    History reviewed. No pertinent past surgical history.   Allergies: No Known Allergies  Home Medications: Prior to Admission medications   Medication Sig Start Date End Date Taking? Authorizing Provider  ibuprofen (ADVIL,MOTRIN) 200 MG tablet Take 600 mg by mouth every 6 (six) hours as needed. For pain   Yes Historical Provider, MD    Inpatient Medications:     . acetaminophen  650 mg Oral Once  .  morphine injection  4 mg Intravenous Once  .  morphine injection  4 mg Intravenous Once  . ondansetron  4 mg Intravenous Once    (Not in a hospital admission)  No family history on file.   History   Social History  . Marital Status: Married    Spouse Name: N/A    Number of Children: N/A  . Years of Education: N/A   Occupational History  . Not on file.   Social History Main Topics  . Smoking status: Former Games developer  . Smokeless tobacco: Not on file  . Alcohol Use: Yes  . Drug Use: Not on file  . Sexually Active:    Other Topics Concern  . Not on file   Social History Narrative  . No narrative on file     Review of Systems: General: positive for chills, fever, night sweats or weight changes.  Cardiovascular: positive for chest pain, dyspnea on exertion, edema,  orthopnea, palpitations, paroxysmal nocturnal dyspnea Dermatological: negative for rash Respiratory: negative for cough or wheezing Urologic: negative for hematuria Abdominal: negative for nausea, vomiting, diarrhea, bright red blood per rectum, melena, or hematemesis Neurologic: positive for lightheadedness, negative for visual changes, syncope All other  systems reviewed and are otherwise negative except as noted above.  Physical Exam: Blood pressure 104/70, pulse 83, temperature 100.4 F (38 C), temperature source Oral, resp. rate 22, SpO2 100.00%.   General: Well developed, well nourished, in no acute distress. Head: Normocephalic, atraumatic, sclera non-icteric, no xanthomas, nares are without discharge.  Neck: Negative for carotid bruits. JVD not elevated. Lungs:  Respirations shallow due to pain, clear bilaterally to auscultation without wheezes, rales, or rhonchi.  Heart: Tachycardic, regular, with S1 S2. No murmurs, rubs, or gallops appreciated. Abdomen: Soft, non-tender, non-distended with normoactive bowel sounds. No hepatomegaly. No rebound/guarding. No obvious abdominal masses. Msk:  Strength and tone appears normal for age. Extremities: No clubbing, cyanosis or edema.  Distal pedal pulses are 2+ and equal bilaterally. Neuro: Alert and oriented X 3. Moves all extremities spontaneously. Psych:  Responds to questions appropriately with a normal affect.  Labs: Recent Labs  Basename 09/18/12 0551 09/18/12 0535 09/16/12 2238   WBC -- 11.3* 10.6*   HGB 15.0 14.2 --   HCT 44.0 40.7 --   MCV -- 87.0 86.2   PLT -- 175 172   Lab 09/18/12 0551 09/18/12 0535 09/16/12 2238  NA 138 134* 137  K 3.7 3.7 3.5  CL 100 97 99  CO2 -- 27 28  BUN 12 13 10   CREATININE 1.30 1.21 1.14  CALCIUM -- 9.7 9.9  PROT -- 7.8 --  BILITOT -- 4.3* --  ALKPHOS -- 253* --  ALT -- 130* --  AST -- 87* --  AMYLASE -- -- --  LIPASE -- -- --  GLUCOSE 163* 171* 107*   Radiology/Studies: Dg Chest 2 View  09/16/2012  *RADIOLOGY REPORT*  Clinical Data: Mid to chest pain and shortness of breath.  CHEST - 2 VIEW  Comparison: 11/09/2010  Findings: Shallow inspiration. Michael heart size and pulmonary vascularity are normal. Michael lungs appear clear and expanded without focal air space disease or consolidation. No blunting of Michael costophrenic angles.  No pneumothorax.   Mediastinal contours appear intact.  No significant change since previous study.  IMPRESSION: No evidence of active pulmonary disease.   Original Report Authenticated By: Marlon Pel, M.D.    Ct Angio Chest Pe W/cm &/or Wo Cm  09/17/2012  *RADIOLOGY REPORT*  Clinical Data: Low grade fever.  Pleurisy.  Rule out pulmonary embolism.  Tachycardia.  CT ANGIOGRAPHY CHEST  Technique:  Multidetector CT imaging of Michael chest using Michael standard protocol during bolus administration of intravenous contrast. Multiplanar reconstructed images including MIPs were obtained and reviewed to evaluate Michael vascular anatomy.  Contrast: OMNIPAQUE IOHEXOL 350 MG/ML SOLN  Comparison: Plain film of 1 day prior.  No prior CT.  Findings: Lung windows demonstrate minimal motion degradation. Nonspecific bronchial wall thickening to Michael right lower lobe. Minimal ground-glass opacity at Michael central right lower lobe is favored to be due to subsegmental atelectasis.  Soft tissue windows:  Michael quality of this exam for evaluation of pulmonary embolism is moderate.  In addition to mild motion artifact, Michael bolus is suboptimally timed, with a large amount contrast remaining in Michael SVC.  No evidence of pulmonary embolism to Michael large segmental level.  Bovine arch. Normal aortic caliber without dissection.  Mild cardiomegaly.  Trace right greater than left pleural fluid, possibly physiologic.  Upper normal subcarinal nodal tissue 1.1 cm. Mild azygo-esophageal recess adenopathy at 1.1 cm on image 39.  No hilar adenopathy. Michael lower thoracic esophagus is mildly dilated with a fluid level within.  Residual thymic tissue in Michael anterior mediastinum.  Limited abdominal imaging demonstrates no significant findings.  No acute osseous abnormality.  IMPRESSION:  1.  Moderate quality exam.  No evidence of pulmonary embolism to Michael large segmental level. 2.  Mild cardiomegaly with trace bilateral pleural fluid.  This could be physiologic or relate to  mild fluid overload. 3.  Prominent mediastinal nodes, favored to be reactive.  Consider follow-up chest CT at 3 - 6 months. 4.  Mildly dilated lower thoracic esophagus.  Question dysmotility or gastroesophageal reflux.   Original Report Authenticated By: Consuello Bossier, M.D.    Dg Chest Port 1 View  09/18/2012  *RADIOLOGY REPORT*  Clinical Data: Shortness of breath and chest pain.  PORTABLE CHEST - 1 VIEW  Comparison: 09/16/2012  Findings: Shallow inspiration.  Increased density in Michael right lung base since previous study probably represents atelectasis. Pneumonia not excluded.  Heart size and pulmonary vascularity are normal for technique.  No blunting of costophrenic angles.  No pneumothorax.  Mediastinal contours appear intact.  IMPRESSION: Shallow inspiration with infiltration or atelectasis in Michael right lung base, new since previous study.   Original Report Authenticated By: Marlon Pel, M.D.     EKG: sinus tachycardia, 114 bpm, diffuse PR depression (1.5 mm)  ASSESSMENT AND PLAN:   1. Acute viral pericarditis- supported by endorsement of fevers, chills and malaise for several days. Pain worse today. EKG c/w prior tracings supporting pericarditis. Initial trop-I WNL. [name] is quite febrile at 100.4. Continue Motrin TID. Has been on NSAID therapy < 24 hrs and needs time to take anti-inflammatory effect. Add colchicine 0.6mg  BID for today, then 0.6mg  daily. Follow-up next week. Dr. Graciela [name] spoke to Dr. Arlyce Dice yest re: NSAIDs and concern for underlying hepatitis. Not felt to be severe enough to d/c NSAIDs. Add PPI. Will continue to monitor in Michael ED. Given one dose of Percocet for short-term relief. If not improved by noon today, admit for pain control.   2. HTN- LVH and cardiomegaly on EKG, CXR and echo likely reflective of structural cardiac changes from HTN. This will need to be well-controlled. BP stable this AM. Not currently on antihypertensive. Will need to follow.   3. OSA- RV/RA  dilatation on echo. Plan for formal sleep study and management to prevent progression of structural changes.  4. Chronic transaminitis- as above; formal GI evaluation recommended.    Signed, R. Hurman Horn, PA-C 09/18/2012, 6:53 AM   ADD: Re-assessed [name]. Has received Percocet x 2 with very minimal pain relief (now 7.5/10). His respirations are still shallow, and can only speak in short sentences. Given intractable pain secondary to acute pericarditis now causing respiratory difficulties, will plan to place in observation with scheduled pain control and O2 supplementation. Will give additional colchicine today. Start daily dosing tomorrow. Continue Motrin TID and PPI. Cycle CEs to r/o ACS/myopericarditis. Note, this original consult note will now serve as Michael H&P.    R. Hurman Horn, PA-C 09/18/2012 11:55 AM  Attending Note:   Michael [name] was seen and examined.  Agree with assessment and plan as noted above.  Michael [name] is a very pleasant young man with recent Dx of pericarditis.  Michael [name] was seen yesterday for follow up and returned early  this am because of severe CP associated with Michael pericarditis.  Michael [name] has severe pleuretic CP - worsened with lying back, slightly better with leaning forward.  ECG has been consistent with Michael dx ( PR depression, minimal ST elevation)  Echo yesterday showed no pericardial effusion.  Michael [name] has received several doses of Motrin and 1 dose of colchicine.  Michael [name] is still having significant pleuretic cp.  Also tried Percocet with only minimal relief.    We will admit him to Observation for pain control while we give Michael motrin and Colchicine a chance to work. i suspect Michael [name] will be able to go home tomorrow.     Michael [name] is hemodynamically stable. Will check Troponin just to make sure this is not coronary ischemia.    Vesta Mixer, Montez Hageman., MD, [facility] 09/18/2012, 12:42 PM

## 2012-09-18 NOTE — Consult Note (Signed)
 Cardiology Consult Note   Patient ID: Wesly L Calamari MRN: [id number], DOB/AGE: 02/27/1981   Admit date: 09/18/2012 Date of Consult: 09/18/2012  Primary Physician: DEFAULT,PROVIDER, MD Primary Cardiologist: seen in the office yesterday by Dr. Klein  Reason for consult: management of recently diagnosed pericarditis  HPI: Mr. 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 He was seen in ED on 10/15 for chest pain. CT revealed neg PE. He was dxed with acute pericarditis.  He followed up with Dr. Klein yesterday. He noted pleuritic chest pain with positional changes and 3-4 days of fevers, chills and malaise. EKG revealed significant PR depression. HPI and EKG changes confirmed acute pericarditis. He was started on Motrin 600mg TID with plans for follow-up next week. Note was made to start colchicine at that time if symptoms were not improving. Inflammatory markers were drawn with aim to follow to resolution. ESR elevated at 50. CRP WNL. Echo performed revealed LVEF 65-70%, moderate LVH, no WMAs, mild RA/RV dilatation. No effusion/tamponade. BP control was stressed, formal sleep study recommended, and formal GI consult for chronic elevated transaminitis.   The pain has since worsened (7/10 yesterday, 8/10 today) thus prompting return to ED. He reports aggravation on inspiration and alleviation sitting forward. Here, EKG reveals diffuse unchanged PR depression. STs appear elevated relative to PR depressions. Code STEMI was initially called to the overnight fellow, but was subsequently canceled after more history and comparison of EKGs were elicited. Initial trop-I WNL. CMET reveals a mild hyponatremia at 134, elevated alk phos at 253, AST 87, ALT 130. CBC with leukocytosis at 11.3. Pt febrile at 100.4. VS otherwise stable. CXR reveals  cardiomegaly, shallow inspiration with infiltration vs atelectasis in lung bases.   Problem List: Past Medical History  Diagnosis Date  . Obstructive sleep apnea 09/17/2012  . Elevated transaminase level/Chronic 09/17/2012  . Elevated blood pressure 09/17/2012  . Left ventricular hypertrophy 09/17/2012  . Acute pericarditis, unspecified 09/17/2012    History reviewed. No pertinent past surgical history.   Allergies: No Known Allergies  Home Medications: Prior to Admission medications   Medication Sig Start Date End Date Taking? Authorizing Provider  ibuprofen (ADVIL,MOTRIN) 200 MG tablet Take 600 mg by mouth every 6 (six) hours as needed. For pain   Yes Historical Provider, MD    Inpatient Medications:     . acetaminophen  650 mg Oral Once  .  morphine injection  4 mg Intravenous Once  .  morphine injection  4 mg Intravenous Once  . ondansetron  4 mg Intravenous Once    (Not in a hospital admission)  No family history on file.   History   Social History  . Marital Status: Married    Spouse Name: N/A    Number of Children: N/A  . Years of Education: N/A   Occupational History  . Not on file.   Social History Main Topics  . Smoking status: Former Smoker  . Smokeless tobacco: Not on file  . Alcohol Use: Yes  . Drug Use: Not on file  . Sexually Active:    Other Topics Concern  . Not on file   Social History Narrative  . No narrative on file     Review of Systems: General: positive for chills, fever, night sweats or weight changes.  Cardiovascular: positive for chest pain, dyspnea on exertion, edema,   orthopnea, palpitations, paroxysmal nocturnal dyspnea Dermatological: negative for rash Respiratory: negative for cough or wheezing Urologic: negative for hematuria Abdominal: negative for nausea, vomiting, diarrhea, bright red blood per rectum, melena, or hematemesis Neurologic: positive for lightheadedness, negative for visual changes, syncope All other  systems reviewed and are otherwise negative except as noted above.  Physical Exam: Blood pressure 104/70, pulse 83, temperature 100.4 F (38 C), temperature source Oral, resp. rate 22, SpO2 100.00%.   General: Well developed, well nourished, in no acute distress. Head: Normocephalic, atraumatic, sclera non-icteric, no xanthomas, nares are without discharge.  Neck: Negative for carotid bruits. JVD not elevated. Lungs:  Respirations shallow due to pain, clear bilaterally to auscultation without wheezes, rales, or rhonchi.  Heart: Tachycardic, regular, with S1 S2. No murmurs, rubs, or gallops appreciated. Abdomen: Soft, non-tender, non-distended with normoactive bowel sounds. No hepatomegaly. No rebound/guarding. No obvious abdominal masses. Msk:  Strength and tone appears normal for age. Extremities: No clubbing, cyanosis or edema.  Distal pedal pulses are 2+ and equal bilaterally. Neuro: Alert and oriented X 3. Moves all extremities spontaneously. Psych:  Responds to questions appropriately with a normal affect.  Labs: Recent Labs  Basename 09/18/12 0551 09/18/12 0535 09/16/12 2238   WBC -- 11.3* 10.6*   HGB 15.0 14.2 --   HCT 44.0 40.7 --   MCV -- 87.0 86.2   PLT -- 175 172   Lab 09/18/12 0551 09/18/12 0535 09/16/12 2238  NA 138 134* 137  K 3.7 3.7 3.5  CL 100 97 99  CO2 -- 27 28  BUN 12 13 10  CREATININE 1.30 1.21 1.14  CALCIUM -- 9.7 9.9  PROT -- 7.8 --  BILITOT -- 4.3* --  ALKPHOS -- 253* --  ALT -- 130* --  AST -- 87* --  AMYLASE -- -- --  LIPASE -- -- --  GLUCOSE 163* 171* 107*   Radiology/Studies: Dg Chest 2 View  09/16/2012  *RADIOLOGY REPORT*  Clinical Data: Mid to chest pain and shortness of breath.  CHEST - 2 VIEW  Comparison: 11/09/2010  Findings: Shallow inspiration. The heart size and pulmonary vascularity are normal. The lungs appear clear and expanded without focal air space disease or consolidation. No blunting of the costophrenic angles.  No pneumothorax.   Mediastinal contours appear intact.  No significant change since previous study.  IMPRESSION: No evidence of active pulmonary disease.   Original Report Authenticated By: WILLIAM R. STEVENS, M.D.    Ct Angio Chest Pe W/cm &/or Wo Cm  09/17/2012  *RADIOLOGY REPORT*  Clinical Data: Low grade fever.  Pleurisy.  Rule out pulmonary embolism.  Tachycardia.  CT ANGIOGRAPHY CHEST  Technique:  Multidetector CT imaging of the chest using the standard protocol during bolus administration of intravenous contrast. Multiplanar reconstructed images including MIPs were obtained and reviewed to evaluate the vascular anatomy.  Contrast: 100mL OMNIPAQUE IOHEXOL 350 MG/ML SOLN  Comparison: Plain film of 1 day prior.  No prior CT.  Findings: Lung windows demonstrate minimal motion degradation. Nonspecific bronchial wall thickening to the right lower lobe. Minimal ground-glass opacity at the central right lower lobe is favored to be due to subsegmental atelectasis.  Soft tissue windows:  The quality of this exam for evaluation of pulmonary embolism is moderate.  In addition to mild motion artifact, the bolus is suboptimally timed, with a large amount contrast remaining in the SVC.  No evidence of pulmonary embolism to the large segmental level.  Bovine arch. Normal aortic caliber without dissection.  Mild cardiomegaly.    Trace right greater than left pleural fluid, possibly physiologic.  Upper normal subcarinal nodal tissue 1.1 cm. Mild azygo-esophageal recess adenopathy at 1.1 cm on image 39.  No hilar adenopathy. The lower thoracic esophagus is mildly dilated with a fluid level within.  Residual thymic tissue in the anterior mediastinum.  Limited abdominal imaging demonstrates no significant findings.  No acute osseous abnormality.  IMPRESSION:  1.  Moderate quality exam.  No evidence of pulmonary embolism to the large segmental level. 2.  Mild cardiomegaly with trace bilateral pleural fluid.  This could be physiologic or relate to  mild fluid overload. 3.  Prominent mediastinal nodes, favored to be reactive.  Consider follow-up chest CT at 3 - 6 months. 4.  Mildly dilated lower thoracic esophagus.  Question dysmotility or gastroesophageal reflux.   Original Report Authenticated By: KYLE D. TALBOT, M.D.    Dg Chest Port 1 View  09/18/2012  *RADIOLOGY REPORT*  Clinical Data: Shortness of breath and chest pain.  PORTABLE CHEST - 1 VIEW  Comparison: 09/16/2012  Findings: Shallow inspiration.  Increased density in the right lung base since previous study probably represents atelectasis. Pneumonia not excluded.  Heart size and pulmonary vascularity are normal for technique.  No blunting of costophrenic angles.  No pneumothorax.  Mediastinal contours appear intact.  IMPRESSION: Shallow inspiration with infiltration or atelectasis in the right lung base, new since previous study.   Original Report Authenticated By: WILLIAM R. STEVENS, M.D.     EKG: sinus tachycardia, 114 bpm, diffuse PR depression (1.5 mm)  ASSESSMENT AND PLAN:   1. Acute viral pericarditis- supported by endorsement of fevers, chills and malaise for several days. Pain worse today. EKG c/w prior tracings supporting pericarditis. Initial trop-I WNL. Patient is quite febrile at 100.4. Continue Motrin TID. Has been on NSAID therapy < 24 hrs and needs time to take anti-inflammatory effect. Add colchicine 0.6mg BID for today, then 0.6mg daily. Follow-up next week. Dr. Klein spoke to Dr. Kaplan yest re: NSAIDs and concern for underlying hepatitis. Not felt to be severe enough to d/c NSAIDs. Add PPI. Will continue to monitor in the ED. Given one dose of Percocet for short-term relief. If not improved by noon today, admit for pain control.   2. HTN- LVH and cardiomegaly on EKG, CXR and echo likely reflective of structural cardiac changes from HTN. This will need to be well-controlled. BP stable this AM. Not currently on antihypertensive. Will need to follow.   3. OSA- RV/RA  dilatation on echo. Plan for formal sleep study and management to prevent progression of structural changes.  4. Chronic transaminitis- as above; formal GI evaluation recommended.    Signed, R. Tony Arguello, PA-C 09/18/2012, 6:53 AM   ADD: Re-assessed patient. Has received Percocet x 2 with very minimal pain relief (now 7.5/10). His respirations are still shallow, and can only speak in short sentences. Given intractable pain secondary to acute pericarditis now causing respiratory difficulties, will plan to place in observation with scheduled pain control and O2 supplementation. Will give additional colchicine today. Start daily dosing tomorrow. Continue Motrin TID and PPI. Cycle CEs to r/o ACS/myopericarditis. Note, this original consult note will now serve as the H&P.    R. Tony Arguello, PA-C 09/18/2012 11:55 AM  Attending Note:   The patient was seen and examined.  Agree with assessment and plan as noted above.  Jeremias is a very pleasant young man with recent Dx of pericarditis.  He was seen yesterday for follow up and returned early   this am because of severe CP associated with the pericarditis.  He has severe pleuretic CP - worsened with lying back, slightly better with leaning forward.  ECG has been consistent with the dx ( PR depression, minimal ST elevation)  Echo yesterday showed no pericardial effusion.  He has received several doses of Motrin and 1 dose of colchicine.  He is still having significant pleuretic cp.  Also tried Percocet with only minimal relief.    We will admit him to Observation for pain control while we give the motrin and Colchicine a chance to work. i suspect he will be able to go home tomorrow.     He is hemodynamically stable. Will check Troponin just to make sure this is not coronary ischemia.    Jaylissa Felty J. Inaya Gillham, Jr., MD, FACC 09/18/2012, 12:42 PM  

## 2012-09-18 NOTE — ED Provider Notes (Signed)
10:17 AM Patient to move to CDU for holding.  Sign out received from Dr Karma Ganja.  Patient with pericarditis, seen today by cardiology.  Plan is for colchicine and percocet, monitor until noon.  If patient feeling better at that time, d/c home.  If continues to be in pain, may need to be admitted for pain control.    10:57 AM Patient reports his pain is unchanged, is 8.5/10.  States he is unable to take a deep breath due to pain.  Patient is A&Ox4, NAD, tachycardic regular rhythm, no m/r/g, mildly tender lower left chest, lungs CTAB though shallow inspirations, extremities without edema, distal pulses intact. Have ordered second percocet.  Will recheck around noon as planned.    11:46 AM Hurman Horn, PA-C, [facility], is here to reassess patient.  Pain is not improving.  Pt to be admitted to cardiology for pain control.    Filed Vitals:   09/18/12 1608  BP: 114/76  Pulse: 97  Temp: 98.7 F (37.1 C)  Resp: 80     Results for orders placed during the hospital encounter of 09/18/12  COMPREHENSIVE METABOLIC PANEL      Component Value Range   Sodium 134 (*) 135 - 145 mEq/L   Potassium 3.7  3.5 - 5.1 mEq/L   Chloride 97  96 - 112 mEq/L   CO2 27  19 - 32 mEq/L   Glucose, Bld 171 (*) 70 - 99 mg/dL   BUN 13  6 - 23 mg/dL   Creatinine, Ser 4.54  0.50 - 1.35 mg/dL   Calcium 9.7  8.4 - 09.8 mg/dL   Total Protein 7.8  6.0 - 8.3 g/dL   Albumin 3.8  3.5 - 5.2 g/dL   AST 87 (*) 0 - 37 U/L   ALT 130 (*) 0 - 53 U/L   Alkaline Phosphatase 253 (*) 39 - 117 U/L   Total Bilirubin 4.3 (*) 0.3 - 1.2 mg/dL   GFR calc non Af Amer 78 (*) >90 mL/min   GFR calc Af Amer >90  >90 mL/min  CBC WITH DIFFERENTIAL      Component Value Range   WBC 11.3 (*) 4.0 - 10.5 K/uL   RBC 4.68  4.22 - 5.81 MIL/uL   Hemoglobin 14.2  13.0 - 17.0 g/dL   HCT 11.9  14.7 - 82.9 %   MCV 87.0  78.0 - 100.0 fL   MCH 30.3  26.0 - 34.0 pg   MCHC 34.9  30.0 - 36.0 g/dL   RDW 56.2  13.0 - 86.5 %   Platelets 175  150 - 400  K/uL   Neutrophils Relative 76  43 - 77 %   Neutro Abs 8.7 (*) 1.7 - 7.7 K/uL   Lymphocytes Relative 13  12 - 46 %   Lymphs Abs 1.5  0.7 - 4.0 K/uL   Monocytes Relative 9  3 - 12 %   Monocytes Absolute 1.0  0.1 - 1.0 K/uL   Eosinophils Relative 1  0 - 5 %   Eosinophils Absolute 0.1  0.0 - 0.7 K/uL   Basophils Relative 0  0 - 1 %   Basophils Absolute 0.0  0.0 - 0.1 K/uL  POCT I-STAT, CHEM 8      Component Value Range   Sodium 138  135 - 145 mEq/L   Potassium 3.7  3.5 - 5.1 mEq/L   Chloride 100  96 - 112 mEq/L   BUN 12  6 - 23 mg/dL   Creatinine,  Ser 1.30  0.50 - 1.35 mg/dL   Glucose, Bld 161 (*) 70 - 99 mg/dL   Calcium, Ion 0.96  0.45 - 1.23 mmol/L   TCO2 26  0 - 100 mmol/L   Hemoglobin 15.0  13.0 - 17.0 g/dL   HCT 40.9  81.1 - 91.4 %  POCT I-STAT TROPONIN I      Component Value Range   Troponin i, poc 0.00  0.00 - 0.08 ng/mL   Comment 3           TROPONIN I      Component Value Range   Troponin I <0.30  <0.30 ng/mL       [place], [place] 09/18/12 1626

## 2012-09-18 NOTE — ED Notes (Signed)
PT. REPORTS PROGRESSING MID CHEST PAIN WITH SOB FOR 3 DAYS , DIAGNOSED WITH PERICARDITIS BY CARDIOLOGIST YESTERDAY , SLIGHT NAUSEA WITH LIGHTHEADEDNESS.

## 2012-09-18 NOTE — ED Notes (Signed)
Patient transported to Ultrasound 

## 2012-09-18 NOTE — Progress Notes (Signed)
Pt arrived to unit and placed on telemetry. Assessment and VS performed. Will continue to monitor.

## 2012-09-19 ENCOUNTER — Encounter (HOSPITAL_COMMUNITY): Payer: Self-pay | Admitting: Physician Assistant

## 2012-09-19 DIAGNOSIS — I319 Disease of pericardium, unspecified: Secondary | ICD-10-CM

## 2012-09-19 LAB — HEPATIC FUNCTION PANEL
ALT: 635 U/L — ABNORMAL HIGH (ref 0–53)
AST: 801 U/L — ABNORMAL HIGH (ref 0–37)
Alkaline Phosphatase: 210 U/L — ABNORMAL HIGH (ref 39–117)
Indirect Bilirubin: 2.5 mg/dL — ABNORMAL HIGH (ref 0.3–0.9)
Total Protein: 7 g/dL (ref 6.0–8.3)

## 2012-09-19 LAB — STREP PNEUMONIAE URINARY ANTIGEN: Strep Pneumo Urinary Antigen: NEGATIVE

## 2012-09-19 LAB — BASIC METABOLIC PANEL
BUN: 25 mg/dL — ABNORMAL HIGH (ref 6–23)
CO2: 24 mEq/L (ref 19–32)
Calcium: 8.9 mg/dL (ref 8.4–10.5)
Chloride: 96 mEq/L (ref 96–112)
Creatinine, Ser: 2.36 mg/dL — ABNORMAL HIGH (ref 0.50–1.35)

## 2012-09-19 LAB — PROTEIN / CREATININE RATIO, URINE: Total Protein, Urine: 9.1 mg/dL

## 2012-09-19 LAB — CK TOTAL AND CKMB (NOT AT ARMC)
CK, MB: 3.5 ng/mL (ref 0.3–4.0)
Relative Index: 2.8 — ABNORMAL HIGH (ref 0.0–2.5)
Total CK: 125 U/L (ref 7–232)

## 2012-09-19 LAB — URINALYSIS, ROUTINE W REFLEX MICROSCOPIC
Glucose, UA: NEGATIVE mg/dL
Hgb urine dipstick: NEGATIVE
Ketones, ur: 15 mg/dL — AB
Nitrite: POSITIVE — AB
Specific Gravity, Urine: 1.028 (ref 1.005–1.030)
pH: 5.5 (ref 5.0–8.0)

## 2012-09-19 LAB — URINE MICROSCOPIC-ADD ON

## 2012-09-19 LAB — HEPATITIS PANEL, ACUTE
HCV Ab: NEGATIVE
Hepatitis B Surface Ag: NEGATIVE

## 2012-09-19 LAB — INFLUENZA PANEL BY PCR (TYPE A & B)
H1N1 flu by pcr: NOT DETECTED
Influenza B By PCR: NEGATIVE

## 2012-09-19 MED ORDER — COLCHICINE 0.6 MG PO TABS
0.6000 mg | ORAL_TABLET | Freq: Every day | ORAL | Status: DC
  Administered 2012-09-20 – 2012-09-22 (×3): 0.6 mg via ORAL
  Filled 2012-09-19 (×4): qty 1

## 2012-09-19 NOTE — Consult Note (Signed)
Physician Assistant [facility] Consult Note [facility]  Date: 09/19/2012  Patient name: Michael [name] Medical record number: [id number] Date of birth: 1981-09-11 Age: 31 y.o. Gender: male PCP: None Medical Service: Cardiology Consulting physician: Derl Barrow    Reason for Consultation: Acute kidney injury History of Present Illness: Mr. Denzer is a 31 yo AA male who has a PMH of elevated LFTs, HTN (2013), left ventricular hypertrophy (2013), and obstructive sleep apnea (2013). He presented to [facility] ED on 10/15 with 24 hrs of chest pain and tachycardia and was diagnosed with acute pericarditis after a negative chest CT for PE. He was discharged and referred to Dr. Sherryl Manges for an outpatient apt on 10/16. Dr. Graciela Husbands saw him on 10/16 and prescribed Motrin 600mg  TID and colchicine. Mr. Bartoszek returned to the [facility] ED on 10/17 with increased pain, SOB and a slight fever (100.4). A EKG at that time showed worsening ST elevation and PR depression. A code STEMI was called which was subsequently canceled after reviewing his previous EKG. In the ED was given morphine which was unable to control his pain and was admitted.   We are consulted for rising cr. His cr on 10/15 was 1.14, on admission on 10/17 it was 1.21, today it is 2.36. His previous records from 06/2009 and 06/2011 indicated a cr of 1.1-1.5. A UA from 06/2009 showed 30mg  of protein in his urine. On admission the patient had 100mg  of protein in urine. Of note the patient received Toradol in the ED on 10/15 and states he took 1 dose of 600mg  Motrin after leaving Dr. Koren Bound office on 10/16. On 10/17 he received 1 dose of 200mg  and 2 doses of 600mg  of Motrin. He also received contrast dye for his CT on 10/15.  He also had one episode of hypotension yesterday at 94/80.The pt states he takes no medications at home, has no family history of kidney problems, and none of his children have kidney issues. He is complaining of mild SOB and  CP that is relieved by sitting up. He states his urine output has been normal and has not noticed any changes in the color of his urine. Denies headache, dizziness, change in vision, abd pain, nausea, vomiting, diarrhea, dysuria, change in bowel habits, weakness, and numbness and tingling in his hands and feet.   In addition to his rising creatinine, he has had an abrupt increase in LFT's, chronically abnormal since his teens, but with an abrupt rise since his hospital admission and GI is evaluating.  Meds: Prior to Admission medications   Medication Sig Start Date End Date Taking? Authorizing Provider  ibuprofen (ADVIL,MOTRIN) 200 MG tablet Take 600 mg by mouth every 6 (six) hours as needed. For pain   Yes Historical Provider, MD   Current facility-administered medications:0.9 %  sodium chloride infusion, 250 mL, Intravenous, PRN, Roger A Arguello, PA-C;  0.9 %  sodium chloride infusion, , Intravenous, Continuous, Dayna N Dunn, PA, Last Rate: 75 mL/hr at 09/19/12 0919;  ALPRAZolam Prudy Feeler) tablet 0.25 mg, 0.25 mg, Oral, BID PRN, Gery Pray, PA-C;  colchicine tablet 0.6 mg, 0.6 mg, Oral, Daily, Roger A Arguello, PA-C, 0.6 mg at 09/19/12 1106 enoxaparin (LOVENOX) injection 40 mg, 40 mg, Subcutaneous, Q24H, Roger A Arguello, PA-C, 40 mg at 09/18/12 2138;  fentaNYL (SUBLIMAZE) injection 25-50 mcg, 25-50 mcg, Intravenous, Q1H PRN, Roger A Arguello, PA-C;  ondansetron (ZOFRAN) injection 4 mg, 4 mg, Intravenous, Q6H PRN, Roger A Arguello, PA-C;  oxyCODONE-acetaminophen (PERCOCET/ROXICET) 5-325  MG per tablet 1-2 tablet, 1-2 tablet, Oral, Q8H, Roger A Arguello, PA-C, 2 tablet at 09/19/12 0535 pantoprazole (PROTONIX) EC tablet 40 mg, 40 mg, Oral, Q0600, Roger A Arguello, PA-C, 40 mg at 09/19/12 0536;  sodium chloride 0.9 % injection 3 mL, 3 mL, Intravenous, Q12H, Roger A Arguello, PA-C, 3 mL at 09/19/12 1106;  sodium chloride 0.9 % injection 3 mL, 3 mL, Intravenous, PRN, Roger A Arguello, PA-C;  zolpidem  (AMBIEN) tablet 5 mg, 5 mg, Oral, QHS PRN,MR X 1, Roger A Arguello, PA-C DISCONTD: colchicine tablet 0.6 mg, 0.6 mg, Oral, BID, Roger A Arguello, PA-C, 0.6 mg at 09/18/12 0840;  DISCONTD: ibuprofen (ADVIL,MOTRIN) tablet 200 mg, 200 mg, Oral, TID, Dayna N Dunn, PA, 200 mg at 09/18/12 2257;  DISCONTD: ibuprofen (ADVIL,MOTRIN) tablet 600 mg, 600 mg, Oral, TID, Roger A Arguello, PA-C, 600 mg at 09/18/12 1729  Allergies: Review of patient's allergies indicates no known allergies. Past Medical History  Diagnosis Date  . Elevated transaminase level/Chronic 09/17/2012    thes date back to age 4.   . Elevated blood pressure 09/17/2012  . Left ventricular hypertrophy 09/17/2012  . Acute pericarditis, unspecified 09/17/2012  . Obesity     on 09/18/12  BMI 35.6 weight 112.2 kg/247#   History reviewed. No pertinent past surgical history. No family history on file. No family history of kidney problems. History   Social History  . Marital Status: Married    Spouse Name: N/A    Number of Children: N/A  . Years of Education: N/A   Occupational History  . Not on file.   Social History Main Topics  . Smoking status: Former Games developer  . Smokeless tobacco: Not on file  . Alcohol Use: Yes  . Drug Use: Not on file  . Sexually Active:    Other Topics Concern  . Not on file   Social History Narrative  . No narrative on file    Review of Systems: A comprehensive review of systems was negative except for: Respiratory: positive for SOB Cardiovascular: positive for chest pain  Physical Exam: Blood pressure 123/72, pulse 106, temperature 99 F (37.2 C), temperature source Oral, resp. rate 18, height 5\' 10"  (1.778 m), weight 112.22 kg (247 lb 6.4 oz), SpO2 96.00%. BP 123/78  Pulse 90  Temp 99 F (37.2 C) (Oral)  Resp 18  Ht 5\' 10"  (1.778 m)  Wt 112.22 kg (247 lb 6.4 oz)  BMI 35.50 kg/m2  SpO2 91% General appearance: alert, cooperative, appears stated age and no distress Extremely muscular  extremities Head: Normocephalic, without obvious abnormality, atraumatic Eyes: conjunctive clear, PERRL, EOMI Throat: lips, mucosa, and tongue normal; teeth and gums normal Neck: no adenopathy, no carotid bruit, no JVD, supple, symmetrical, trachea midline and thyroid not enlarged, symmetric, no tenderness/mass/nodules Back: No CVA tenderness Lungs: clear to auscultation bilaterally Heart: regular rate and rhythm, S1, S2 normal, no murmur, click, rub or gallop No pericardial friction rub Abdomen: soft, non-tender; bowel sounds normal; no masses,  no organomegaly Extremities: extremities normal, atraumatic, no cyanosis or edema Pulses: 2+ and symmetric Skin: Skin color, texture, turgor normal. No rashes or lesions Tattoos on his arms and trunk Neurologic: Grossly normal   Total I/O In: 480 [P.O.:480] Out: 1000 [Urine:1000]  Lab 09/19/12 0520 09/18/12 1815 09/18/12 0551 09/18/12 0535 09/16/12 2238  NA 134* -- 138 134* --  K 4.3 -- 3.7 3.7 --  CL 96 -- 100 97 --  CO2 24 -- -- 27 28  GLUCOSE 123* --  163* 171* --  BUN 25* -- 12 13 --  CREATININE 2.36* 1.86* 1.30 -- --  CALCIUM 8.9 -- -- 9.7 9.9  ALB -- -- -- -- --  PHOS -- -- -- -- --   Liver Function Tests:  Lab 09/19/12 0520 09/18/12 0535 09/16/12 2238  AST 801* 87* 73*  ALT 635* 130* 108*  ALKPHOS 210* 253* 190*  BILITOT 5.5* 4.3* 3.0*  PROT 7.0 7.8 8.1  ALBUMIN 3.3* 3.8 4.5   CBC:  Lab 09/18/12 1815 09/18/12 0551 09/18/12 0535 09/16/12 2238  WBC 19.1* -- 11.3* 10.6*  NEUTROABS -- -- 8.7* 7.4  HGB 14.2 15.0 14.2 --  HCT 40.8 44.0 40.7 --  MCV 87.0 -- 87.0 86.2  PLT 213 -- 175 172   Cardiac Enzymes:  Lab 09/18/12 2127 09/18/12 1439  CKTOTAL -- --  CKMB -- --  CKMBINDEX -- --  TROPONINI <0.30 <0.30   Studies/Results: Dg Chest 2 View  09/18/2012  *RADIOLOGY REPORT*  Clinical Data: Mid chest pain.  Shortness of breath.  Fever.  CHEST - 2 VIEW  Comparison: 09/18/2012 at 0557 hours the  Findings: Shallow  inspiration.  Cardiac enlargement with borderline pulmonary vascularity.  Infiltration or atelectasis in the lung bases.  No blunting of costophrenic angles.  No pneumothorax.  IMPRESSION: Cardiac enlargement.  Shallow inspiration with infiltration or atelectasis in the lung bases.   Original Report Authenticated By: Marlon Pel, M.D.    US Abdomen Complete  09/18/2012  *RADIOLOGY REPORT*  Clinical Data:  Elevated LFTs.  COMPLETE ABDOMINAL ULTRASOUND  Comparison:  Chest CT 09/17/2012  Findings:  Gallbladder:  No stones or wall thickening.  Negative sonographic Murphy's.  Common bile duct:   Normal caliber, 3 mm.  Liver:  No focal lesion identified.  Within normal limits in parenchymal echogenicity.  IVC:  Appears normal.  Pancreas:  No focal abnormality seen.  Spleen:  Within normal limits in size and echotexture.  Right Kidney:   Normal in size and parenchymal echogenicity.  No evidence of mass or hydronephrosis.  Left Kidney:  Normal in size and parenchymal echogenicity.  No evidence of mass or hydronephrosis.  Abdominal aorta:  No aneurysm identified.  Small bilateral pleural effusions noted.  IMPRESSION: No acute findings.   Original Report Authenticated By: Cyndie Chime, M.D.    Dg Chest Port 1 View  09/18/2012  *RADIOLOGY REPORT*  Clinical Data: Shortness of breath and chest pain.  PORTABLE CHEST - 1 VIEW  Comparison: 09/16/2012  Findings: Shallow inspiration.  Increased density in the right lung base since previous study probably represents atelectasis. Pneumonia not excluded.  Heart size and pulmonary vascularity are normal for technique.  No blunting of costophrenic angles.  No pneumothorax.  Mediastinal contours appear intact.  IMPRESSION: Shallow inspiration with infiltration or atelectasis in the right lung base, new since previous study.   Original Report Authenticated By: Marlon Pel, M.D.   ADD: Kidney sizes on ultrasound 12.3 left, 11.2 right Not echodense   Assessment &  Plan by Problem:  Mr. Bolam is a 31 yo AA male who has a PMH of elevated LFTs, HTN (2013), left ventricular hypertrophy (2013), and obstructive sleep apnea (2013).  1. AKI .  Doubling of creatinine representing loss of 50% of GFR.  Likely due to NSAIDs, contrast dye, and hypotension. Urine output remains good (1L yesterday). Though history of elevated cr and proteinuria with elevated LFTs and acute pericarditis may suggest an underlying syndrome and would prompt additional serologic evaluation.  -  continue IV fluids at 43ml/hr  - strict I & Os  - monitor closely  - renal labs am    2. Acute Pericarditis- avoid NSAIDs  - continue per primary team  3. HTN- well controlled this admission, will need outpatient follow-up (not on any medication)  - continue to monitor  This is a PA Student Note.  The care of the patient was discussed with Dr. Eliott Nine and the assessment and plan was formulated with their assistance.  Please see their note for official documentation of the patient encounter.   Signed: V. [place], [place], PA-S2  09/19/2012, 1:29 PM  I  Have seen and examined the patient.  I agree with the history and assessment as outlined in the above note.  77 ye AAM with only PMH prior to current dx of pericarditis (? Idiopathic? ? Viral?) being that of abnormal LFT's longstanding.  He presented with acute pericarditis and in the process of workup received IV contrast, NSAIDS, had some soft BP's.  AKI may all be secondary to these insults.  Kidneys are normal sized and non echogenic on ultrasound without evidence of hydronephrosis. His past creatinines have been 1.1 (2010) to 1.5 (06/2011)  It is of note that he had dipstick proteinuria in 2010 and also on his current UA, with only 0-2 RBC's.  ESR is high, LFT's abnormal - makes one wonder about a possible link between the LFT's, pericarditis and AKI.  Will obtain ANA and complements, check UPC, and then further w/u depending on those results and clinical  course of his renal dysfunction.    Agree with administration of IVF although he does not look particularly dry.  Thanks for the consult.  Will follow with you.

## 2012-09-19 NOTE — Progress Notes (Signed)
Called lab per MD request.  Influenza panels are not processed on night shift according to lab and will be completed this morning.  Lab is aware MD is awaiting the results.  Pt failed to give sample overnight. He forgot it was needed.  Urine sample collected this morning and sent to lab.  Will await results and notify as needed. Dereck Ligas McClintock\

## 2012-09-19 NOTE — Consult Note (Signed)
    [facility]     Reason for Consult:pericarditis etiology    Referring Physician: Dr. Elease Hashimoto  Principal Problem:  *Acute viral pericarditis Active Problems:  Elevated transaminase level/Chronic  Obstructive sleep apnea  Hypertension      . colchicine  0.6 mg Oral Daily  . colchicine  0.6 mg Oral Daily  . enoxaparin (LOVENOX) injection  40 mg Subcutaneous Q24H  . oxyCODONE-acetaminophen  1-2 tablet Oral Q8H  . pantoprazole  40 mg Oral Q0600  . sodium chloride  3 mL Intravenous Q12H  . DISCONTD: colchicine  0.6 mg Oral BID  . DISCONTD: ibuprofen  200 mg Oral TID  . DISCONTD: ibuprofen  600 mg Oral TID    Recommendations: Check EBV,  Echoviruses, HIV, blood bacterial, fungal and AFB cultures.  Mycoplasma.  Hepatitis panel negative.   No role for procalcitonin since it is not validated, will d/c.    Assessment: Pericarditis of unknown cause with significant transaminitis.    Antibiotics: none  HPI: Michael [name] is a 31 y.o. male with a history of chronic transaminitis and recently diagnosed with pericarditis during an ED visit who was seen in the cardiology office and confirmed pericarditis.  He was started on Motrin and came back to ED due to significant chest pain, positional.  He has no history of SLE in him or family, no autoimmune disease in family including IBD, RA or otherwise that he is aware of.  He tells me he had no fever or chills, though the cardiology history relates that he did.  His ESR was mildly elevated, CRP normal.     Review of Systems: Pertinent items are noted in HPI.  Past Medical History  Diagnosis Date  . Elevated transaminase level/Chronic 09/17/2012    thes date back to age 22.   . Elevated blood pressure 09/17/2012  . Left ventricular hypertrophy 09/17/2012  . Acute pericarditis, unspecified 09/17/2012  . Obesity     on 09/18/12  BMI 35.6 weight 112.2 kg/247#    History  Substance Use Topics  . Smoking  status: Former Games developer  . Smokeless tobacco: Not on file  . Alcohol Use: Yes    No family history on file. No Known Allergies  OBJECTIVE: Blood pressure 123/78, pulse 90, temperature 99 F (37.2 C), temperature source Oral, resp. rate 18, height 5\' 10"  (1.778 m), weight 247 lb 6.4 oz (112.22 kg), SpO2 91.00%. General: AAO x 3, nad Skin: no rashes Lungs: CTA B Cor: distant, RRR, no rubs, no m/g Abdomen: soft, ntnd, +bs HEENT: icteric  Microbiology: No results found for this or any previous visit (from the past 240 hour(s)).  Staci Righter, MD [facility] [phone] pager  [phone] cell 09/19/2012, 3:36 PM

## 2012-09-19 NOTE — Progress Notes (Addendum)
Lab called that Influenza sample was unacceptable and needed to be resent. Explained need to recheck to pt and resent a new sample.  Will continue to check and call MD when results are posted. Michael [name] [facility] lab and informed of problem with first run and will have to reprocess.  Lab states that results should be complete in approximately 32 minutes (13:05).  I will call and follow up. Michael [name]

## 2012-09-19 NOTE — Progress Notes (Signed)
   Subjective:  Denies dyspnea; chest pain with inspiration-improved but persists   Objective:  Filed Vitals:   09/18/12 1400 09/18/12 1608 09/18/12 2120 09/19/12 0540  BP: 117/87 114/76 121/84 123/72  Pulse: 108 97 112 106  Temp:  98.7 F (37.1 C) 98.1 F (36.7 C) 99 F (37.2 C)  TempSrc:  Oral Oral Oral  Resp:  80 20 18  Height:  5\' 10"  (1.778 m)    Weight:  247 lb 6.4 oz (112.22 kg)    SpO2: 96% 100% 90% 96%    Intake/Output from previous day: No intake or output data in the 24 hours ending 09/19/12 0743  Physical Exam: Physical exam: Well-developed well-nourished in no acute distress.  Skin is warm and dry.  HEENT is normal.  Neck is supple.  Chest with diminished BS bases Cardiovascular exam is regular rate and rhythm.  Abdominal exam nontender or distended. No masses palpated. Extremities show no edema. neuro grossly intact    Lab Results: Basic Metabolic Panel:  Basename 09/19/12 0520 09/18/12 1815 09/18/12 0551 09/18/12 0535  NA 134* -- 138 --  K 4.3 -- 3.7 --  CL 96 -- 100 --  CO2 24 -- -- 27  GLUCOSE 123* -- 163* --  BUN 25* -- 12 --  CREATININE 2.36* 1.86* -- --  CALCIUM 8.9 -- -- 9.7  MG -- -- -- --  PHOS -- -- -- --   CBC:  Basename 09/18/12 1815 09/18/12 0551 09/18/12 0535 09/16/12 2238  WBC 19.1* -- 11.3* --  NEUTROABS -- -- 8.7* 7.4  HGB 14.2 15.0 -- --  HCT 40.8 44.0 -- --  MCV 87.0 -- 87.0 --  PLT 213 -- 175 --   Cardiac Enzymes:  Basename 09/18/12 2127 09/18/12 1439  CKTOTAL -- --  CKMB -- --  CKMBINDEX -- --  TROPONINI <0.30 <0.30     Assessment/Plan:  1) Pericarditis - continue cochicine; dc motrin given acute renal insufficiency. 2) Acute renal insuff - Etiology unclear; DC motrin; ? Contribution from contrast; hydrate and follow renal function; nephrology consult 3) Elevated LFTs - acute on chronic component; ? Viral; GI following; abdominal ultrasound negative.  Olga Millers 09/19/2012, 7:43 AM

## 2012-09-20 ENCOUNTER — Observation Stay (HOSPITAL_COMMUNITY): Payer: BC Managed Care – PPO

## 2012-09-20 DIAGNOSIS — I10 Essential (primary) hypertension: Secondary | ICD-10-CM

## 2012-09-20 DIAGNOSIS — I319 Disease of pericardium, unspecified: Secondary | ICD-10-CM

## 2012-09-20 DIAGNOSIS — I300 Acute nonspecific idiopathic pericarditis: Principal | ICD-10-CM

## 2012-09-20 LAB — CBC
MCH: 29.8 pg (ref 26.0–34.0)
MCV: 86.1 fL (ref 78.0–100.0)
Platelets: 212 10*3/uL (ref 150–400)
RDW: 12.5 % (ref 11.5–15.5)

## 2012-09-20 LAB — COMPREHENSIVE METABOLIC PANEL
ALT: 382 U/L — ABNORMAL HIGH (ref 0–53)
AST: 183 U/L — ABNORMAL HIGH (ref 0–37)
Albumin: 3.1 g/dL — ABNORMAL LOW (ref 3.5–5.2)
Alkaline Phosphatase: 176 U/L — ABNORMAL HIGH (ref 39–117)
Chloride: 95 mEq/L — ABNORMAL LOW (ref 96–112)
Potassium: 4 mEq/L (ref 3.5–5.1)
Total Bilirubin: 4.2 mg/dL — ABNORMAL HIGH (ref 0.3–1.2)

## 2012-09-20 LAB — HIV ANTIBODY (ROUTINE TESTING W REFLEX): HIV: NONREACTIVE

## 2012-09-20 MED ORDER — MAGNESIUM HYDROXIDE 400 MG/5ML PO SUSP
15.0000 mL | Freq: Once | ORAL | Status: AC
  Administered 2012-09-20: 15 mL via ORAL
  Filled 2012-09-20: qty 30

## 2012-09-20 MED ORDER — FUROSEMIDE 10 MG/ML IJ SOLN
40.0000 mg | Freq: Once | INTRAMUSCULAR | Status: AC
  Administered 2012-09-20: 40 mg via INTRAVENOUS
  Filled 2012-09-20: qty 4

## 2012-09-20 MED ORDER — ACETAMINOPHEN 325 MG PO TABS: 650.0000 mg | ORAL_TABLET | Freq: Four times a day (QID) | ORAL | Status: DC | PRN

## 2012-09-20 MED ORDER — POTASSIUM CHLORIDE CRYS ER 20 MEQ PO TBCR
40.0000 meq | EXTENDED_RELEASE_TABLET | Freq: Once | ORAL | Status: AC
  Administered 2012-09-20: 40 meq via ORAL
  Filled 2012-09-20: qty 2

## 2012-09-20 MED ORDER — ACETAMINOPHEN 325 MG PO TABS
650.0000 mg | ORAL_TABLET | Freq: Four times a day (QID) | ORAL | Status: DC | PRN
  Administered 2012-09-20: 650 mg via ORAL
  Filled 2012-09-20: qty 2

## 2012-09-20 NOTE — Progress Notes (Signed)
2D echo shows evidence of a new moderate pericardial effusion and evidence of early hemodynamic compromise.  Clinically he appears to be stable and bedside exam reveals no significant paradoxical pulse.  However we will move him to stepdown unit for closer observation for any clinical deterioration.

## 2012-09-20 NOTE — Progress Notes (Signed)
  Echocardiogram 2D Echocardiogram has been performed.  Michael [name] FRANCES 09/20/2012, 4:50 PM

## 2012-09-20 NOTE — Progress Notes (Signed)
Patient's CXR on 2 V essentially very similar vs earlier in the day. Clinically he is on less O2 now. Fever curve and hemodynamics not worse per RN. I still favor that this evolving process in lungs is cardiogenic with pulmonary edema and now moderate pericardial effusion. Certainly possibiliy of a secondary bacterial infection on top of what was an initial likely viral or autoimmune event is possible. For now will continue to observe off antibiotics in 2900. I will fu his am cxr and clinical status tomorrrow.

## 2012-09-20 NOTE — Progress Notes (Addendum)
We were called by nurse because of intermittent drops in O2 sats to 70s on nasal oxygen 2L/min.  Now sats up to 94 on nonrebreather mask. Chest pain is unchanged. Temp is 100.5  EKG was repeated and shows changes of acute pericarditis which are unchanged from earlier today. Exam reveals decreased breath sounds at bases and poor inspiratory effort because of splinting from his pleuritic chest pain. No definite pericardial rub heard although heart tones are somewhat distant. Will check a portable chest xray. Tylenol for fever.  Addendum: Chest xray (my reading) shows increase heart size and new airspace disease in right lung ? asymmetric pulmonary edema.  No cough or sputum. Will reduce IV rate and give one dose lasix.Michael [name]

## 2012-09-20 NOTE — Progress Notes (Signed)
[facility] R[facility]ING NOTE:  SUBJECTIVE: Feels well Feels he is making more urine (600 in urinal from last void) CP better  OBJECTIVE: Temp:  [98.9 F (37.2 C)-99 F (37.2 C)] 98.9 F (37.2 C) (10/19 0514) Pulse Rate:  [90-106] 106  (10/19 0514) Cardiac Rhythm:  [-] Sinus tachycardia (10/18 2002) Resp:  [18-20] 20  (10/19 0514) BP: (123-128)/(75-81) 128/75 mmHg (10/19 0514) SpO2:  [90 %-91 %] 90 % (10/19 0514) Visit Vitals BP 128/75  Pulse 106  Temp 98.9 F (37.2 C) (Oral)  Resp 20  Ht 5\' 10"  (1.778 m)  Wt 112.22 kg (247 lb 6.4 oz)  BMI 35.50 kg/m2  SpO2 90% I/O last 3 completed shifts: In: 480 [P.O.:480] Out: 1450 [Urine:1450]    Physical Examination: Lungs clear No pericardial rub  HT sounds somewhat distant No LE edema  LABS: Basic Metabolic Panel:  Basename 09/20/12 0545 09/19/12 0520  NA 130* 134*  K 4.0 4.3  CL 95* 96  CO2 24 24  GLUCOSE 110* 123*  BUN 23 25*  CREATININE 2.33* 2.36*  CALCIUM 9.1 8.9  MG -- --  PHOS -- --   Liver Function Tests:  [facility] 09/20/12 0545 09/19/12 0520  AST 183* 801*  ALT 382* 635*  ALKPHOS 176* 210*  BILITOT 4.2* 5.5*  PROT 6.8 7.0  ALBUMIN 3.1* 3.3*   CBC:  Basename 09/20/12 0545 09/18/12 1815 09/18/12 0535  WBC 10.6* 19.1* --  NEUTROABS -- -- 8.7*  HGB 12.0* 14.2 --  HCT 34.7* 40.8 --  MCV 86.1 87.0 --  PLT 212 213 --   Results for ALEKSI, BRUMMET (MRN [id number]) as of 09/20/2012 07:16  Ref. Range 09/19/2012 15:27  C3 Complement Latest Range: 90-180 mg/dL 621  Complement C4, Body Fluid Latest Range: 10-40 mg/dL 42 (H)   Results for JULIUS, MATUS (MRN [id number]) as of 09/20/2012 07:16  09/19/2012 16:16  PROTEIN CREATININE RATIO 0.19 (H)   Results for DAISON, BRAXTON (MRN [id number]) as of 09/20/2012 07:16  Ref. Range 09/18/2012 14:38 09/19/2012 09:41 09/19/2012 15:27  Influenza A By PCR Latest Range: NEGATIVE   NEGATIVE   Influenza B By PCR Latest Range: NEGATIVE   NEGATIVE   H1N1 flu by pcr  Latest Range: NOT DETECTED   NOT DETECTED   Hep A IgM Latest Range: NEGATIVE  NEGATIVE    Hepatitis B Surface Ag Latest Range: NEGATIVE  NEGATIVE    Hep B C IgM Latest Range: NEGATIVE  NEGATIVE    HCV Ab Latest Range: NEGATIVE  NEGATIVE    HIV Latest Range: NON REACTIVE    NON REACTIVE   ANA pending  IMPRESSION/RECOMMENDATIONS  Mr. Blasius is a 31 yo AA male who has a PMH of elevated LFTs, HTN (2013), left ventricular hypertrophy (2013), and obstructive sleep apnea (2013) who presents with AKI in the setting of acute pericarditis.  AKI  Most likely due to NSAIDs (toradol, ibuprofen, naproxyn), contrast dye (CT angio), and hypotension (relative/a few episodes) Additional serologic info unrevealing thus far (ANA still pending) Proteinuria is minimal by UPC Kidneys normal size by [place] Complements normal, Hep serologies negative Creatinine has reached plateau UOP non-oliguric Expect renal function will turn around next 24 hours.  Tylee Yum B

## 2012-09-20 NOTE — Progress Notes (Signed)
Progress Note for [facility]  Subjective: Feeling better.  No acute events.  Objective: Vital signs in last 24 hours: Temp:  [98.9 F (37.2 C)-99 F (37.2 C)] 98.9 F (37.2 C) (10/19 0514) Pulse Rate:  [90-106] 106  (10/19 0514) Resp:  [18-20] 20  (10/19 0514) BP: (123-128)/(75-81) 128/75 mmHg (10/19 0514) SpO2:  [90 %-91 %] 90 % (10/19 0514) Last BM Date: 09/18/12  Intake/Output from previous day: 10/18 0701 - 10/19 0700 In: 480 [P.O.:480] Out: 1450 [Urine:1450] Intake/Output this shift: Total I/O In: -  Out: 400 [Urine:400]  General appearance: alert and no distress GI: soft, non-tender; bowel sounds normal; no masses,  no organomegaly  Lab Results:  Basename 09/20/12 0545 09/18/12 1815 09/18/12 0551 09/18/12 0535  WBC 10.6* 19.1* -- 11.3*  HGB 12.0* 14.2 15.0 --  HCT 34.7* 40.8 44.0 --  PLT 212 213 -- 175   BMET  Basename 09/20/12 0545 09/19/12 0520 09/18/12 1815 09/18/12 0551 09/18/12 0535  NA 130* 134* -- 138 --  K 4.0 4.3 -- 3.7 --  CL 95* 96 -- 100 --  CO2 24 24 -- -- 27  GLUCOSE 110* 123* -- 163* --  BUN 23 25* -- 12 --  CREATININE 2.33* 2.36* 1.86* -- --  CALCIUM 9.1 8.9 -- -- 9.7   LFT  Basename 09/20/12 0545 09/19/12 0520  PROT 6.8 --  ALBUMIN 3.1* --  AST 183* --  ALT 382* --  ALKPHOS 176* --  BILITOT 4.2* --  BILIDIR -- 3.0*  IBILI -- 2.5*   PT/INR  Basename 09/20/12 0545  LABPROT 16.8*  INR 1.40   Hepatitis Panel  Basename 09/18/12 1438  HEPBSAG NEGATIVE  HCVAB NEGATIVE  HEPAIGM NEGATIVE  HEPBIGM NEGATIVE   C-Diff No results found for this basename: CDIFFTOX:3 in the last 72 hours Fecal Lactopherrin No results found for this basename: FECLLACTOFRN in the last 72 hours  Studies/Results: US Abdomen Complete  09/18/2012  *RADIOLOGY REPORT*  Clinical Data:  Elevated LFTs.  COMPLETE ABDOMINAL ULTRASOUND  Comparison:  Chest CT 09/17/2012  Findings:  Gallbladder:  No stones or wall thickening.  Negative sonographic Murphy's.   Common bile duct:   Normal caliber, 3 mm.  Liver:  No focal lesion identified.  Within normal limits in parenchymal echogenicity.  IVC:  Appears normal.  Pancreas:  No focal abnormality seen.  Spleen:  Within normal limits in size and echotexture.  Right Kidney:   Normal in size and parenchymal echogenicity.  No evidence of mass or hydronephrosis.  Left Kidney:  Normal in size and parenchymal echogenicity.  No evidence of mass or hydronephrosis.  Abdominal aorta:  No aneurysm identified.  Small bilateral pleural effusions noted.  IMPRESSION: No acute findings.   Original Report Authenticated By: Cyndie Chime, M.D.     Medications:  Scheduled:   . colchicine  0.6 mg Oral Daily  . enoxaparin (LOVENOX) injection  40 mg Subcutaneous Q24H  . oxyCODONE-acetaminophen  1-2 tablet Oral Q8H  . pantoprazole  40 mg Oral Q0600  . sodium chloride  3 mL Intravenous Q12H   Continuous:   . sodium chloride 75 mL/hr at 09/20/12 1044    Assessment/Plan: 1) Abnormal liver enzymes. 2) Viral pericarditis.   Overall the patient is well.  His transaminases have markedly declined, but his creatinine has increased.  The acute hepatitis panel is negative for HBV or HCV.  Plan: 1) Continue with supportive care.   2) No intervention for the GI standpoint. 3) Follow liver enzymes.  They should normalize soon.  LOS: 2 days   Leith Szafranski D 09/20/2012, 12:05 PM

## 2012-09-20 NOTE — Progress Notes (Signed)
[facility]    Subjective: Pt still febrile, he now also desatted into the 70s and has bilateral opacities on CXR considered possible Pulm edema vs PNA, 2d echo now with large pericardial effusion (I am hearing from RN)  Antibiotics:  Anti-infectives    None      Medications: Scheduled Meds:   . colchicine  0.6 mg Oral Daily  . enoxaparin (LOVENOX) injection  40 mg Subcutaneous Q24H  . furosemide  40 mg Intravenous Once  . oxyCODONE-acetaminophen  1-2 tablet Oral Q8H  . pantoprazole  40 mg Oral Q0600  . potassium chloride  40 mEq Oral Once  . sodium chloride  3 mL Intravenous Q12H   Continuous Infusions:   . sodium chloride 20 mL/hr at 09/20/12 1513   PRN Meds:.sodium chloride, acetaminophen, ALPRAZolam, fentaNYL, ondansetron (ZOFRAN) IV, sodium chloride, zolpidem, DISCONTD: acetaminophen   Objective: Weight change:   Intake/Output Summary (Last 24 hours) at 09/20/12 1708 Last data filed at 09/20/12 1047  Gross per 24 hour  Intake      0 ml  Output    850 ml  Net   -850 ml   Blood pressure 120/77, pulse 117, temperature 100.6 F (38.1 C), temperature source Oral, resp. rate 20, height 5\' 10"  (1.778 m), weight 247 lb 6.4 oz (112.22 kg), SpO2 90.00%. Temp:  [98.9 F (37.2 C)-100.6 F (38.1 C)] 100.6 F (38.1 C) (10/19 1326) Pulse Rate:  [103-117] 117  (10/19 1326) Resp:  [20] 20  (10/19 1326) BP: (120-128)/(75-81) 120/77 mmHg (10/19 1326) SpO2:  [90 %-91 %] 90 % (10/19 0514)  Physical Exam: General: Alert and awake, oriented x3, wearing NRB but states he feels better HEENT: anicteric sclera, pupils reactive to light and accommodation, EOMI CVS tachycardic rate, normal r,  no murmur rubs or gallops Chest: dec breath sounds at the bases but no wheezing, rales or rhonchi Abdomen: soft nontender, nondistended, normal bowel sounds, Extremities: no  clubbing or edema noted bilaterally Skin: no rashes Lymph: no new  lymphadenopathy Neuro: nonfocal  Lab Results:  Basename 09/20/12 0545 09/18/12 1815  WBC 10.6* 19.1*  HGB 12.0* 14.2  HCT 34.7* 40.8  PLT 212 213    BMET  Basename 09/20/12 0545 09/19/12 0520  NA 130* 134*  K 4.0 4.3  CL 95* 96  CO2 24 24  GLUCOSE 110* 123*  BUN 23 25*  CREATININE 2.33* 2.36*  CALCIUM 9.1 8.9    Micro Results: Recent Results (from the past 240 hour(s))  CULTURE, BLOOD (ROUTINE X 2)     Status: Normal (Preliminary result)   Collection Time   09/18/12  9:20 PM      Component Value Range Status Comment   Specimen Description BLOOD RIGHT ARM   Final    Special Requests BOTTLES DRAWN AEROBIC AND ANAEROBIC 10CC   Final    Culture  Setup Time 09/19/2012 02:06   Final    Culture     Final    Value:        BLOOD CULTURE RECEIVED NO GROWTH TO DATE CULTURE WILL BE HELD FOR 5 DAYS BEFORE ISSUING A FINAL NEGATIVE REPORT   Report Status PENDING   Incomplete   CULTURE, BLOOD (ROUTINE X 2)     Status: Normal (Preliminary result)   Collection Time   09/18/12  9:25 PM      Component Value Range Status Comment   Specimen Description BLOOD RIGHT HAND   Final    Special Requests BOTTLES  DRAWN AEROBIC AND ANAEROBIC 10CC   Final    Culture  Setup Time 09/19/2012 02:06   Final    Culture     Final    Value:        BLOOD CULTURE RECEIVED NO GROWTH TO DATE CULTURE WILL BE HELD FOR 5 DAYS BEFORE ISSUING A FINAL NEGATIVE REPORT   Report Status PENDING   Incomplete   AFB CULTURE, BLOOD     Status: Normal (Preliminary result)   Collection Time   09/19/12  4:26 PM      Component Value Range Status Comment   Specimen Description BLOOD RIGHT HAND   Final    Special Requests ACID FAST BACILLI 5CC   Final    Culture     Final    Value: CULTURE WILL BE EXAMINED FOR 6 WEEKS BEFORE ISSUING A FINAL REPORT   Report Status PENDING   Incomplete     Studies/Results: Dg Chest Port 1 View  09/20/2012  *RADIOLOGY REPORT*  Clinical Data: Hypoxia  PORTABLE CHEST - 1 VIEW  Comparison:  09/18/2012  Findings: Interval development of extensive bilateral airspace disease in the lung bases.  This may be pneumonia or edema. Hypoventilation with decreased lung volume.  No significant pleural effusion.  IMPRESSION: Interval development of extensive bibasilar airspace disease which may represent pneumonia or pulmonary edema.   Original Report Authenticated By: Camelia Phenes, M.D.       Assessment/Plan: Michael [name] is a 31 y.o. male with   Chronic transaminitis, OSA, LVH admitted with acute pericarditis, fevers now with worsening hypoxia, pulmonary opacities and NEW pericardial effusion.  1) Pericarditis: He has multiple serologies, cultures pending. Certainly Now that he also has a pericardial effusion, if this effusion is tapped we would want it sent for bacterial and fungal cultures. I do not haveSTRONG susp that this is due to a bacterial pyogenic process at this point in time  2) Pericardial effusion: See above discussion. Await formal read of 2d echo by cardiology. If there is evidence of loculation or something to suggest this is a pyogenic effusion would pursue pericardiocentesis for cultures and add in vancomycin as antibiotic  3) PUlmonary opacities: My suspicion is more that these opacities are due to pulmonary edema in setting of volume overload and now developing pulmonary edema.   --I will recheck CXR later tonight and in the am.  Certainly please do not hesistate to call me back if primary team's suspicion heightens for infection and I am NOT completely opposed to empiric broad spectrum antibiotics THOUGH I THINK ADDING THEM WOULD CLOUD THE PICTURE AT THIS POINT   LOS: 2 days   Acey Lav 09/20/2012, 5:08 PM

## 2012-09-20 NOTE — Progress Notes (Signed)
Report called to RN for [id number]. Will transport pt soon. Harlow Asa

## 2012-09-20 NOTE — Progress Notes (Signed)
Subjective: CP is improving--only when takes big breath.  NO SOB Objective: Filed Vitals:   09/19/12 0540 09/19/12 1300 09/19/12 1953 09/20/12 0514  BP: 123/72 123/78 123/81 128/75  Pulse: 106 90 103 106  Temp: 99 F (37.2 C) 99 F (37.2 C) 99 F (37.2 C) 98.9 F (37.2 C)  TempSrc: Oral Oral Oral Oral  Resp: 18 18 20 20   Height:      Weight:      SpO2: 96% 91% 91% 90%   Weight change:   Intake/Output Summary (Last 24 hours) at 09/20/12 1054 Last data filed at 09/20/12 1047  Gross per 24 hour  Intake    240 ml  Output   1200 ml  Net   -960 ml    General: Alert, awake, oriented x3, in no acute distress Neck:  JVP is normal Heart: Regular rate and rhythm, without murmurs, rubs, gallops.  Lungs: Clear to auscultation.  No rales or wheezes. Exemities:  No edema.   Neuro: Grossly intact, nonfocal.  Tele: ST 110s  Lab Results: Results for orders placed during the hospital encounter of 09/18/12 (from the past 24 hour(s))  CERULOPLASMIN     Status: Normal   Collection Time   09/19/12 11:42 AM      Component Value Range   Ceruloplasmin 44  20 - 60 mg/dL  C3 COMPLEMENT     Status: Normal   Collection Time   09/19/12  3:27 PM      Component Value Range   C3 Complement 165  90 - 180 mg/dL  C4 COMPLEMENT     Status: Abnormal   Collection Time   09/19/12  3:27 PM      Component Value Range   Complement C4, Body Fluid 42 (*) 10 - 40 mg/dL  CK TOTAL AND CKMB     Status: Abnormal   Collection Time   09/19/12  3:27 PM      Component Value Range   Total CK 125  7 - 232 U/L   CK, MB 3.5  0.3 - 4.0 ng/mL   Relative Index 2.8 (*) 0.0 - 2.5  HIV ANTIBODY (ROUTINE TESTING)     Status: Normal   Collection Time   09/19/12  3:27 PM      Component Value Range   HIV NON REACTIVE  NON REACTIVE  PROTEIN / CREATININE RATIO, URINE     Status: Abnormal   Collection Time   09/19/12  4:16 PM      Component Value Range   Creatinine, Urine 47.72     Total Protein, Urine 9.1     PROTEIN CREATININE RATIO 0.19 (*) 0.00 - 0.15  AFB CULTURE, BLOOD     Status: Normal (Preliminary result)   Collection Time   09/19/12  4:26 PM      Component Value Range   Specimen Description BLOOD RIGHT HAND     Special Requests ACID FAST BACILLI 5CC     Culture       Value: CULTURE WILL BE EXAMINED FOR 6 WEEKS BEFORE ISSUING A FINAL REPORT   Report Status PENDING    COMPREHENSIVE METABOLIC PANEL     Status: Abnormal   Collection Time   09/20/12  5:45 AM      Component Value Range   Sodium 130 (*) 135 - 145 mEq/L   Potassium 4.0  3.5 - 5.1 mEq/L   Chloride 95 (*) 96 - 112 mEq/L   CO2 24  19 - 32 mEq/L  Glucose, Bld 110 (*) 70 - 99 mg/dL   BUN 23  6 - 23 mg/dL   Creatinine, Ser 1.61 (*) 0.50 - 1.35 mg/dL   Calcium 9.1  8.4 - 09.6 mg/dL   Total Protein 6.8  6.0 - 8.3 g/dL   Albumin 3.1 (*) 3.5 - 5.2 g/dL   AST 045 (*) 0 - 37 U/L   ALT 382 (*) 0 - 53 U/L   Alkaline Phosphatase 176 (*) 39 - 117 U/L   Total Bilirubin 4.2 (*) 0.3 - 1.2 mg/dL   GFR calc non Af Amer 35 (*) >90 mL/min   GFR calc Af Amer 41 (*) >90 mL/min  CBC     Status: Abnormal   Collection Time   09/20/12  5:45 AM      Component Value Range   WBC 10.6 (*) 4.0 - 10.5 K/uL   RBC 4.03 (*) 4.22 - 5.81 MIL/uL   Hemoglobin 12.0 (*) 13.0 - 17.0 g/dL   HCT 40.9 (*) 81.1 - 91.4 %   MCV 86.1  78.0 - 100.0 fL   MCH 29.8  26.0 - 34.0 pg   MCHC 34.6  30.0 - 36.0 g/dL   RDW 78.2  95.6 - 21.3 %   Platelets 212  150 - 400 K/uL  PROTIME-INR     Status: Abnormal   Collection Time   09/20/12  5:45 AM      Component Value Range   Prothrombin Time 16.8 (*) 11.6 - 15.2 seconds   INR 1.40  0.00 - 1.49    Studies/Results: @RISRSLT24 @  Medications: Reviewed.   Patient Active Hospital Problem List: Acute viral pericarditis (09/18/2012)   Assessment: Symptoms are improving with colchicine.  COntinue  Serologies negative so far.  Remains mildly tachycardic  WIll follow  COntinue IV fluids.    Elevated transaminase  level/Chronic (09/17/2012)   Assessment: Improving.  Prob viral etiol for abnormalities.    Obstructive sleep apnea (09/17/2012)    Hypertension (09/18/2012)   Assessment: Follow   Renal   Appreciate input from Dr. Eliott Nine.  Renal function has plateaued.  WIll keep on IV fluids Encourage po fluids.  LOS: 2 days   Dietrich Pates 09/20/2012, 10:54 AM

## 2012-09-21 ENCOUNTER — Observation Stay (HOSPITAL_COMMUNITY): Payer: BC Managed Care – PPO

## 2012-09-21 LAB — COMPREHENSIVE METABOLIC PANEL
ALT: 298 U/L — ABNORMAL HIGH (ref 0–53)
AST: 131 U/L — ABNORMAL HIGH (ref 0–37)
Albumin: 2.9 g/dL — ABNORMAL LOW (ref 3.5–5.2)
Alkaline Phosphatase: 159 U/L — ABNORMAL HIGH (ref 39–117)
Calcium: 9.3 mg/dL (ref 8.4–10.5)
GFR calc Af Amer: 43 mL/min — ABNORMAL LOW (ref >90)
Glucose, Bld: 99 mg/dL (ref 70–99)
Potassium: 3.7 mEq/L (ref 3.5–5.1)
Sodium: 138 mEq/L (ref 135–145)
Total Protein: 6.8 g/dL (ref 6.0–8.3)

## 2012-09-21 LAB — CBC WITH DIFFERENTIAL/PLATELET
Basophils Absolute: 0 10*3/uL (ref 0.0–0.1)
Basophils Relative: 0 % (ref 0–1)
Basophils Relative: 0 % (ref 0–1)
Eosinophils Absolute: 0.1 10*3/uL (ref 0.0–0.7)
Eosinophils Absolute: 0.3 10*3/uL (ref 0.0–0.7)
Eosinophils Relative: 2 % (ref 0–5)
Eosinophils Relative: 3 % (ref 0–5)
HCT: 32.5 % — ABNORMAL LOW (ref 39.0–52.0)
Hemoglobin: 10.9 g/dL — ABNORMAL LOW (ref 13.0–17.0)
Lymphs Abs: 1.1 10*3/uL (ref 0.7–4.0)
MCH: 29 pg (ref 26.0–34.0)
MCH: 29.6 pg (ref 26.0–34.0)
MCHC: 33.5 g/dL (ref 30.0–36.0)
MCHC: 34.2 g/dL (ref 30.0–36.0)
MCV: 86.4 fL (ref 78.0–100.0)
MCV: 86.8 fL (ref 78.0–100.0)
Monocytes Absolute: 1.2 10*3/uL — ABNORMAL HIGH (ref 0.1–1.0)
Monocytes Relative: 14 % — ABNORMAL HIGH (ref 3–12)
Neutrophils Relative %: 69 % (ref 43–77)
Platelets: 238 10*3/uL (ref 150–400)
RBC: 3.71 MIL/uL — ABNORMAL LOW (ref 4.22–5.81)
RDW: 12.9 % (ref 11.5–15.5)

## 2012-09-21 MED ORDER — FUROSEMIDE 40 MG PO TABS
40.0000 mg | ORAL_TABLET | Freq: Once | ORAL | Status: AC
  Administered 2012-09-21: 40 mg via ORAL
  Filled 2012-09-21: qty 1

## 2012-09-21 MED ORDER — MAGNESIUM HYDROXIDE 400 MG/5ML PO SUSP
30.0000 mL | Freq: Once | ORAL | Status: AC
  Administered 2012-09-22: 30 mL via ORAL
  Filled 2012-09-21: qty 30

## 2012-09-21 MED ORDER — POTASSIUM CHLORIDE CRYS ER 20 MEQ PO TBCR
40.0000 meq | EXTENDED_RELEASE_TABLET | Freq: Once | ORAL | Status: AC
  Administered 2012-09-21: 40 meq via ORAL
  Filled 2012-09-21: qty 2

## 2012-09-21 NOTE — Progress Notes (Signed)
CRITICAL VALUE ALERT  Critical value received:  Gram + rods in aerobic blood culture  Date of notification:  09/21/2012   Time of notification:  1140    Critical value read back:yes  Nurse who received alert:  Tammy Sours  MD notified (1st page):  Dr. Tresa Garter. Daiva Eves  Time of first page:  1220  MD notified (2nd page):  Time of second page:  Responding MD:  Dr. Tresa Garter. Daiva Eves  Time MD responded:  [phone]

## 2012-09-21 NOTE — Progress Notes (Signed)
[facility]ING NOTE:   SUBJECTIVE: Moved to ICU yesterday after intermittent O2 desat, fever, worsening CXR and development of pericardial effusion on exam. Today says he has no pain, or SOB Wearing nasal O2  Sats [place] Cardiology has seen and feel no evidence of tamponade although echo suggested "early compromise" based on mitral inflow pattern Received a couple of doses of lasix yesterday UOP yesterday 3.9 liters  OBJECTIVE: Temp:  [98.2 F (36.8 C)-100.6 F (38.1 C)] 99.2 F (37.3 C) (10/20 0754) Pulse Rate:  [86-117] 97  (10/20 0800) Cardiac Rhythm:  [-] Normal sinus rhythm (10/20 0700) Resp:  [18-28] 22  (10/20 0800) BP: (120-157)/(64-88) 148/83 mmHg (10/20 0800) SpO2:  [92 %-99 %] 94 % (10/20 0800) Weight:  [111.6 kg (246 lb 0.5 oz)-111.8 kg (246 lb 7.6 oz)] 111.6 kg (246 lb 0.5 oz) (10/20 0500)  Visit vitals: BP 148/83  Pulse 97  Temp 99.2 F (37.3 C) (Oral)  Resp 22  Ht 5\' 10"  (1.778 m)  Wt 111.6 kg (246 lb 0.5 oz)  BMI 35.30 kg/m2  SpO2 94% Sleeping when I went in [place] felt OK though was sweating when I examined Dimished breath sounds at bases I do not hear a rub Abdomen soft and non tender No edema of LE's   Lab 09/21/12 0600 09/20/12 0545 09/19/12 0520 09/18/12 1815 09/18/12 0551 09/18/12 0535 09/16/12 2238  NA 138 130* 134* -- 138 134* 137  K 3.7 4.0 4.3 -- 3.7 3.7 3.5  CL 100 95* 96 -- 100 97 99  CO2 26 24 24  -- -- 27 28  GLUCOSE 99 110* 123* -- 163* 171* 107*  BUN 22 23 25* -- 12 13 10   CREATININE 2.24* 2.33* 2.36* 1.86* 1.30 1.21 1.14  ALB -- -- -- -- -- -- --  CALCIUM 9.3 9.1 8.9 -- -- 9.7 9.9  PHOS -- -- -- -- -- -- --     Lab 09/21/12 0600 09/20/12 0545 09/19/12 0520  AST 131* 183* 801*  ALT 298* 382* 635*  ALKPHOS 159* 176* 210*  BILITOT 2.5* 4.2* 5.5*  PROT 6.8 6.8 7.0  ALBUMIN 2.9* 3.1* 3.3*   Lab 09/21/12 0600 09/20/12 0545 09/18/12 1815 09/18/12 0551 09/18/12 0535 09/16/12 2238  WBC 8.7 10.6* 19.1* -- 11.3* --  NEUTROABS  6.2 -- -- -- 8.7* 7.4  HGB 10.9* 12.0* 14.2 15.0 -- --  HCT 32.5* 34.7* 40.8 44.0 -- --  MCV 86.4 86.1 87.0 -- 87.0 --  PLT 242 212 213 -- 175 --   Lab 09/19/12 1527 09/18/12 2127 09/18/12 1439  CKTOTAL 125 -- --  CKMB 3.5 -- --  CKMBINDEX -- -- --  TROPONINI -- <0.30 <0.30  Studies/Results: Dg Chest 2 View  09/21/2012  *RADIOLOGY REPORT*  Clinical Data: Evaluate for edema versus infection  CHEST - 2 VIEW  Comparison: 09/20/2012  Findings: Heart size appears normal. There are bilateral pleural effusions left greater than right. Persistent bilateral airspace opacities are again noted, right greater than left.  Not significantly improved from previous exam.  IMPRESSION:  1.  No significant change in bilateral airspace opacities and pleural effusions.   Original Report Authenticated By: Rosealee Albee, M.D.    Dg Chest 2 View  09/20/2012  *RADIOLOGY REPORT*  Clinical Data: Shortness of breath.  CHEST - 2 VIEW  Comparison: Same day.  Findings: Mild bilateral pleural effusions are noted. Cardiomediastinal silhouette appears normal.  Right upper and lower lobe opacities are again noted and unchanged compared to prior  exam.  Left basilar opacity is also noted which is unchanged.  IMPRESSION: Bilateral lung opacities and pleural effusions are again noted and unchanged; these findings consistent with bilateral pneumonia or possibly edema.   Original Report Authenticated By: Venita Sheffield., M.D.    Dg Chest Port 1 View  09/20/2012  *RADIOLOGY REPORT*  Clinical Data: Hypoxia  PORTABLE CHEST - 1 VIEW  Comparison: 09/18/2012  Findings: Interval development of extensive bilateral airspace disease in the lung bases.  This may be pneumonia or edema. Hypoventilation with decreased lung volume.  No significant pleural effusion.  IMPRESSION: Interval development of extensive bibasilar airspace disease which may represent pneumonia or pulmonary edema.   Original Report Authenticated By: Camelia Phenes, M.D.     Medications and Infusions . sodium chloride 20 mL/hr at 09/21/12 0600   . colchicine  0.6 mg Oral Daily  . enoxaparin (LOVENOX) injection  40 mg Subcutaneous Q24H  . furosemide  40 mg Intravenous Once  . furosemide  40 mg Oral Once  . magnesium hydroxide  15 mL Oral Once  . oxyCODONE-acetaminophen  1-2 tablet Oral Q8H  . pantoprazole  40 mg Oral Q0600  . potassium chloride  40 mEq Oral Once  . potassium chloride  40 mEq Oral Once  . sodium chloride  3 mL Intravenous Q12H   ASSESSMENT/RECOMMENDATIONS Mr. Gegenheimer is a 31 yo AA male who has a PMH of elevated LFTs, HTN (2013), left ventricular hypertrophy (2013), and obstructive sleep apnea (2013) who presents with AKI in the setting of acute pericarditis. (Baseline creatinine in past 1-1.5; aruond 1.1-1.2 on admission)  AKI   Most likely due to NSAIDs (toradol, ibuprofen, naproxyn), contrast dye (CT angio), and hypotension (relative/a few episodes)   Additional serologic info unrevealing thus far (ANA still pending and apparently not done as ordered)  Proteinuria is minimal by UPC (190 mg)  Kidneys normal size by [place]  Complements normal, Hep serologies negative  Creatinine has reached plateau  UOP non-oliguric (with lasix) Expect renal function will turn around    Camille Bal, MD [facility] [phone] Pager 09/21/2012, 11:36 AM

## 2012-09-21 NOTE — Progress Notes (Signed)
Progress Note for [facility]  Subjective: Feeling great.  Objective: Vital signs in last 24 hours: Temp:  [98.2 F (36.8 C)-100.6 F (38.1 C)] 99.2 F (37.3 C) (10/20 0754) Pulse Rate:  [86-117] 95  (10/20 0700) Resp:  [18-28] 24  (10/20 0700) BP: (120-157)/(64-88) 138/86 mmHg (10/20 0700) SpO2:  [92 %-99 %] 98 % (10/20 0700) Weight:  [111.6 kg (246 lb 0.5 oz)-111.8 kg (246 lb 7.6 oz)] 111.6 kg (246 lb 0.5 oz) (10/20 0500) Last BM Date: 09/18/12  Intake/Output from previous day: 10/19 0701 - 10/20 0700 In: 800 [P.O.:600; I.V.:200] Out: 3900 [Urine:3900] Intake/Output this shift:    General appearance: alert and no distress GI: soft, non-tender; bowel sounds normal; no masses,  no organomegaly  Lab Results:  Basename 09/21/12 0600 09/20/12 0545 09/18/12 1815  WBC 8.7 10.6* 19.1*  HGB 10.9* 12.0* 14.2  HCT 32.5* 34.7* 40.8  PLT 242 212 213   BMET  Basename 09/21/12 0600 09/20/12 0545 09/19/12 0520  NA 138 130* 134*  K 3.7 4.0 4.3  CL 100 95* 96  CO2 26 24 24   GLUCOSE 99 110* 123*  BUN 22 23 25*  CREATININE 2.24* 2.33* 2.36*  CALCIUM 9.3 9.1 8.9   LFT  Basename 09/21/12 0600 09/19/12 0520  PROT 6.8 --  ALBUMIN 2.9* --  AST 131* --  ALT 298* --  ALKPHOS 159* --  BILITOT 2.5* --  BILIDIR -- 3.0*  IBILI -- 2.5*   PT/INR  Basename 09/20/12 0545  LABPROT 16.8*  INR 1.40   Hepatitis Panel  Basename 09/18/12 1438  HEPBSAG NEGATIVE  HCVAB NEGATIVE  HEPAIGM NEGATIVE  HEPBIGM NEGATIVE   C-Diff No results found for this basename: CDIFFTOX:3 in the last 72 hours Fecal Lactopherrin No results found for this basename: FECLLACTOFRN in the last 72 hours  Studies/Results: Dg Chest 2 View  09/20/2012  *RADIOLOGY REPORT*  Clinical Data: Shortness of breath.  CHEST - 2 VIEW  Comparison: Same day.  Findings: Mild bilateral pleural effusions are noted. Cardiomediastinal silhouette appears normal.  Right upper and lower lobe opacities are again noted and  unchanged compared to prior exam.  Left basilar opacity is also noted which is unchanged.  IMPRESSION: Bilateral lung opacities and pleural effusions are again noted and unchanged; these findings consistent with bilateral pneumonia or possibly edema.   Original Report Authenticated By: Venita Sheffield., M.D.    Dg Chest Port 1 View  09/20/2012  *RADIOLOGY REPORT*  Clinical Data: Hypoxia  PORTABLE CHEST - 1 VIEW  Comparison: 09/18/2012  Findings: Interval development of extensive bilateral airspace disease in the lung bases.  This may be pneumonia or edema. Hypoventilation with decreased lung volume.  No significant pleural effusion.  IMPRESSION: Interval development of extensive bibasilar airspace disease which may represent pneumonia or pulmonary edema.   Original Report Authenticated By: Camelia Phenes, M.D.     Medications:  Scheduled:   . colchicine  0.6 mg Oral Daily  . enoxaparin (LOVENOX) injection  40 mg Subcutaneous Q24H  . furosemide  40 mg Intravenous Once  . magnesium hydroxide  15 mL Oral Once  . oxyCODONE-acetaminophen  1-2 tablet Oral Q8H  . pantoprazole  40 mg Oral Q0600  . potassium chloride  40 mEq Oral Once  . sodium chloride  3 mL Intravenous Q12H   Continuous:   . sodium chloride 20 mL/hr at 09/21/12 0600    Assessment/Plan: 1) Abnormal liver enzymes. 2) Viral pericardidtis.   His liver enzymes continue to  trend down.  Clinically he appears to be well.  Plan: 1) Continue with supportive care.  LOS: 3 days   Jhett Fretwell D 09/21/2012, 8:53 AM

## 2012-09-21 NOTE — Progress Notes (Signed)
[facility]    Subjective: Pt  Feels better today, less dyspnea less chest pain  Antibiotics: NONE Anti-infectives    None      Medications: Scheduled Meds:    . colchicine  0.6 mg Oral Daily  . enoxaparin (LOVENOX) injection  40 mg Subcutaneous Q24H  . furosemide  40 mg Intravenous Once  . furosemide  40 mg Oral Once  . magnesium hydroxide  15 mL Oral Once  . oxyCODONE-acetaminophen  1-2 tablet Oral Q8H  . pantoprazole  40 mg Oral Q0600  . potassium chloride  40 mEq Oral Once  . potassium chloride  40 mEq Oral Once  . sodium chloride  3 mL Intravenous Q12H   Continuous Infusions:    . sodium chloride 20 mL/hr at 09/21/12 0600   PRN Meds:.sodium chloride, acetaminophen, ALPRAZolam, fentaNYL, ondansetron (ZOFRAN) IV, sodium chloride, zolpidem, DISCONTD: acetaminophen   Objective: Weight change:   Intake/Output Summary (Last 24 hours) at 09/21/12 1343 Last data filed at 09/21/12 1200  Gross per 24 hour  Intake    800 ml  Output   4000 ml  Net  -3200 ml   Blood pressure 123/87, pulse 93, temperature 99.7 F (37.6 C), temperature source Oral, resp. rate 21, height 5\' 10"  (1.778 m), weight 246 lb 0.5 oz (111.6 kg), SpO2 96.00%. Temp:  [98.2 F (36.8 C)-99.7 F (37.6 C)] 99.7 F (37.6 C) (10/20 1142) Pulse Rate:  [86-103] 93  (10/20 1140) Resp:  [18-28] 21  (10/20 1140) BP: (123-157)/(64-88) 123/87 mmHg (10/20 1140) SpO2:  [92 %-99 %] 96 % (10/20 1140) Weight:  [246 lb 0.5 oz (111.6 kg)-246 lb 7.6 oz (111.8 kg)] 246 lb 0.5 oz (111.6 kg) (10/20 0500)  Physical Exam: General: Alert and awake, oriented x3, wearing NRB but states he feels better HEENT: anicteric sclera, pupils reactive to light and accommodation, EOMI CVS tachycardic rate, normal r,  no murmur rubs or gallops Chest:  More shallow breath that normal dec breath sounds at the bases but no wheezing, rales or rhonchi Abdomen: soft nontender, nondistended, normal bowel  sounds, Extremities: no  clubbing or edema noted bilaterally Skin: no rashes Neuro: nonfocal  Lab Results:  Basename 09/21/12 1045 09/21/12 0600  WBC 8.9 8.7  HGB 11.0* 10.9*  HCT 32.2* 32.5*  PLT 238 242    BMET  Basename 09/21/12 0600 09/20/12 0545  NA 138 130*  K 3.7 4.0  CL 100 95*  CO2 26 24  GLUCOSE 99 110*  BUN 22 23  CREATININE 2.24* 2.33*  CALCIUM 9.3 9.1    Micro Results: Recent Results (from the past 240 hour(s))  CULTURE, BLOOD (ROUTINE X 2)     Status: Normal (Preliminary result)   Collection Time   09/18/12  9:20 PM      Component Value Range Status Comment   Specimen Description BLOOD RIGHT ARM   Final    Special Requests BOTTLES DRAWN AEROBIC AND ANAEROBIC 10CC   Final    Culture  Setup Time 09/19/2012 02:06   Final    Culture     Final    Value:        BLOOD CULTURE RECEIVED NO GROWTH TO DATE CULTURE WILL BE HELD FOR 5 DAYS BEFORE ISSUING A FINAL NEGATIVE REPORT   Report Status PENDING   Incomplete   CULTURE, BLOOD (ROUTINE X 2)     Status: Normal (Preliminary result)   Collection Time   09/18/12  9:25 PM  Component Value Range Status Comment   Specimen Description BLOOD RIGHT HAND   Final    Special Requests BOTTLES DRAWN AEROBIC AND ANAEROBIC 10CC   Final    Culture  Setup Time 09/19/2012 02:06   Final    Culture     Final    Value:        BLOOD CULTURE RECEIVED NO GROWTH TO DATE CULTURE WILL BE HELD FOR 5 DAYS BEFORE ISSUING A FINAL NEGATIVE REPORT   Report Status PENDING   Incomplete   CULTURE, BLOOD (ROUTINE X 2)     Status: Normal (Preliminary result)   Collection Time   09/19/12  4:26 PM      Component Value Range Status Comment   Specimen Description BLOOD RIGHT HAND   Final    Special Requests BOTTLES DRAWN AEROBIC AND ANAEROBIC 10CC   Final    Culture  Setup Time 09/20/2012 01:38   Final    Culture     Final    Value: GRAM POSITIVE RODS     Note: Gram Stain Report Called to,Read Back By and Verified With: DANA SULLIVAN 12:20PM  09/21/12 BY GUSTK   Report Status PENDING   Incomplete   AFB CULTURE, BLOOD     Status: Normal (Preliminary result)   Collection Time   09/19/12  4:26 PM      Component Value Range Status Comment   Specimen Description BLOOD RIGHT HAND   Final    Special Requests ACID FAST BACILLI 5CC   Final    Culture     Final    Value: CULTURE WILL BE EXAMINED FOR 6 WEEKS BEFORE ISSUING A FINAL REPORT   Report Status PENDING   Incomplete   CULTURE, BLOOD (ROUTINE X 2)     Status: Normal (Preliminary result)   Collection Time   09/19/12  4:39 PM      Component Value Range Status Comment   Specimen Description BLOOD RIGHT ARM   Final    Special Requests BOTTLES DRAWN AEROBIC AND ANAEROBIC 10CC   Final    Culture  Setup Time 09/20/2012 02:06   Final    Culture     Final    Value:        BLOOD CULTURE RECEIVED NO GROWTH TO DATE CULTURE WILL BE HELD FOR 5 DAYS BEFORE ISSUING A FINAL NEGATIVE REPORT   Report Status PENDING   Incomplete   MRSA PCR SCREENING     Status: Normal   Collection Time   09/20/12  8:36 PM      Component Value Range Status Comment   MRSA by PCR NEGATIVE  NEGATIVE Final     Studies/Results: Dg Chest 2 View  09/21/2012  *RADIOLOGY REPORT*  Clinical Data: Evaluate for edema versus infection  CHEST - 2 VIEW  Comparison: 09/20/2012  Findings: Heart size appears normal. There are bilateral pleural effusions left greater than right. Persistent bilateral airspace opacities are again noted, right greater than left.  Not significantly improved from previous exam.  IMPRESSION:  1.  No significant change in bilateral airspace opacities and pleural effusions.   Original Report Authenticated By: Rosealee Albee, M.D.    Dg Chest 2 View  09/20/2012  *RADIOLOGY REPORT*  Clinical Data: Shortness of breath.  CHEST - 2 VIEW  Comparison: Same day.  Findings: Mild bilateral pleural effusions are noted. Cardiomediastinal silhouette appears normal.  Right upper and lower lobe opacities are again  noted and unchanged compared to prior exam.  Left basilar  opacity is also noted which is unchanged.  IMPRESSION: Bilateral lung opacities and pleural effusions are again noted and unchanged; these findings consistent with bilateral pneumonia or possibly edema.   Original Report Authenticated By: Venita Sheffield., M.D.    Dg Chest Port 1 View  09/20/2012  *RADIOLOGY REPORT*  Clinical Data: Hypoxia  PORTABLE CHEST - 1 VIEW  Comparison: 09/18/2012  Findings: Interval development of extensive bilateral airspace disease in the lung bases.  This may be pneumonia or edema. Hypoventilation with decreased lung volume.  No significant pleural effusion.  IMPRESSION: Interval development of extensive bibasilar airspace disease which may represent pneumonia or pulmonary edema.   Original Report Authenticated By: Camelia Phenes, M.D.       Assessment/Plan: Michael [name] is a 31 y.o. 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  1) Pericarditis: He has multiple serologies, cultures pending.FLU pcr negative.  Effusion is moderate and free flowing I do not haveSTRONG susp that this is due to a bacterial pyogenic process at this point in time but if fluid is collected from pericardial space would send for bacterial, afb and fungal cultures  2) Pericardial effusion: See above discussion.  3) PUlmonary opacities: My suspicion is more that these opacities are due to pulmonary edema in setting of volume overload and now developing pulmonary edema and I am continuing to withold abx  4) GPR in 1/2 blood culture undoubtedly a diptheroid= contaminant     LOS: 3 days   Acey Lav 09/21/2012, 1:43 PM

## 2012-09-21 NOTE — Progress Notes (Signed)
Reviewed echo from yesterday.  Pericardial effusion was increased from 9/16.  SOme signs of hemodyn compromise but improved clinically.  Would do limited echo in AM (effusion size, mitral inflow, IVC)

## 2012-09-21 NOTE — Progress Notes (Signed)
Subjective:  Patient feels much better this am. Back on nasal oxygen 3L with O2 sat up to 94. No chest pain now. No evidence of tamponade clinically. BP 148/83.  Objective:  Vital Signs in the last 24 hours: Temp:  [98.2 F (36.8 C)-100.6 F (38.1 C)] 99.2 F (37.3 C) (10/20 0754) Pulse Rate:  [86-117] 97  (10/20 0800) Resp:  [18-28] 22  (10/20 0800) BP: (120-157)/(64-88) 148/83 mmHg (10/20 0800) SpO2:  [92 %-99 %] 94 % (10/20 0800) Weight:  [246 lb 0.5 oz (111.6 kg)-246 lb 7.6 oz (111.8 kg)] 246 lb 0.5 oz (111.6 kg) (10/20 0500)  Intake/Output from previous day: 10/19 0701 - 10/20 0700 In: 800 [P.O.:600; I.V.:200] Out: 3900 [Urine:3900] Intake/Output from this shift:       . colchicine  0.6 mg Oral Daily  . enoxaparin (LOVENOX) injection  40 mg Subcutaneous Q24H  . furosemide  40 mg Intravenous Once  . magnesium hydroxide  15 mL Oral Once  . oxyCODONE-acetaminophen  1-2 tablet Oral Q8H  . pantoprazole  40 mg Oral Q0600  . potassium chloride  40 mEq Oral Once  . sodium chloride  3 mL Intravenous Q12H      . sodium chloride 20 mL/hr at 09/21/12 0600    Physical Exam: The patient appears to be in no distress.  Head and neck exam reveals that the pupils are equal and reactive.  The extraocular movements are full.  There is no scleral icterus.  Mouth and pharynx are benign.  No lymphadenopathy.  No carotid bruits.  The jugular venous pressure is normal.  Thyroid is not enlarged or tender.  Chest is clear to percussion and auscultation.  No rales or rhonchi.  Expansion of the chest is symmetrical.Improved breath sounds at bases.  Heart reveals no abnormal lift or heave.  First and second heart sounds are normal.  There is no murmur gallop rub or click.  The abdomen is soft and nontender.  Bowel sounds are normoactive.  There is no hepatosplenomegaly or mass.  There are no abdominal bruits.  Extremities reveal no phlebitis or edema.  Pedal pulses are good.  There is  no cyanosis or clubbing.  Neurologic exam is normal strength and no lateralizing weakness.  No sensory deficits.  Integument reveals no rash  Lab Results:  Basename 09/21/12 0600 09/20/12 0545  WBC 8.7 10.6*  HGB 10.9* 12.0*  PLT 242 212    Basename 09/21/12 0600 09/20/12 0545  NA 138 130*  K 3.7 4.0  CL 100 95*  CO2 26 24  GLUCOSE 99 110*  BUN 22 23  CREATININE 2.24* 2.33*    Basename 09/18/12 2127 09/18/12 1439  TROPONINI <0.30 <0.30   Hepatic Function Panel  Basename 09/21/12 0600 09/19/12 0520  PROT 6.8 --  ALBUMIN 2.9* --  AST 131* --  ALT 298* --  ALKPHOS 159* --  BILITOT 2.5* --  BILIDIR -- 3.0*  IBILI -- 2.5*   No results found for this basename: CHOL in the last 72 hours No results found for this basename: PROTIME in the last 72 hours 2 Imaging: Dg Chest 2 View  09/21/2012  *RADIOLOGY REPORT*  Clinical Data: Evaluate for edema versus infection  CHEST - 2 VIEW  Comparison: 09/20/2012  Findings: Heart size appears normal. There are bilateral pleural effusions left greater than right. Persistent bilateral airspace opacities are again noted, right greater than left.  Not significantly improved from previous exam.  IMPRESSION:  1.  No significant change in  bilateral airspace opacities and pleural effusions.   Original Report Authenticated By: Rosealee Albee, M.D.    Dg Chest 2 View  09/20/2012  *RADIOLOGY REPORT*  Clinical Data: Shortness of breath.  CHEST - 2 VIEW  Comparison: Same day.  Findings: Mild bilateral pleural effusions are noted. Cardiomediastinal silhouette appears normal.  Right upper and lower lobe opacities are again noted and unchanged compared to prior exam.  Left basilar opacity is also noted which is unchanged.  IMPRESSION: Bilateral lung opacities and pleural effusions are again noted and unchanged; these findings consistent with bilateral pneumonia or possibly edema.   Original Report Authenticated By: Venita Sheffield., M.D.    Dg Chest  Port 1 View  09/20/2012  *RADIOLOGY REPORT*  Clinical Data: Hypoxia  PORTABLE CHEST - 1 VIEW  Comparison: 09/18/2012  Findings: Interval development of extensive bilateral airspace disease in the lung bases.  This may be pneumonia or edema. Hypoventilation with decreased lung volume.  No significant pleural effusion.  IMPRESSION: Interval development of extensive bibasilar airspace disease which may represent pneumonia or pulmonary edema.   Original Report Authenticated By: Camelia Phenes, M.D.     Cardiac Studies: 2D echo 09/20/12 reveals:           2D Echocardiogram with contrast      Ordering Physician:  Laurann Montana, PA  Order# [id number]  Study Date:  09/20/12      Patient Information      Name  MRN   Description     Michael [name]  [id number]   31 year old Male              Result Narrative     *[facility]* *[facility]* [address] [place], [place] [address] [phone]  ------------------------------------------------------------ Transthoracic Echocardiography  Patient: Michael, [name] MR #: [id number] Study Date: 09/20/2012 Gender: M Age: 84 Height: 177.8cm Weight: 112.2kg BSA: 2.71m^2 Pt. Status: Room: 2007  PERFORMING [place], [facility] SONOGRAPHER [place], RCS ORDERING [place], [place] cc:  ------------------------------------------------------------ LV EF: 65% - 70%  ------------------------------------------------------------ Indications: Chest pain 786.51. Respiratory Failure acute 518.81.  ------------------------------------------------------------ History: Risk factors: Former tobacco use. Hypertension.  ------------------------------------------------------------ Study Conclusions  - Left ventricle: The cavity size was normal. Wall thickness was increased in a pattern of mild LVH. Systolic function was vigorous. The estimated ejection fraction was in the range of 65% to 70%. Wall motion was  normal; there were no regional wall motion abnormalities. - Pericardium, extracardiac: A moderate, free-flowing pericardial effusion was identified circumferential to the heart. The fluid had no internal echoes. There was a left pleural effusion. Transthoracic echocardiography. M-mode, complete 2D, spectral Doppler, and color Doppler. Height: Height: 177.8cm. Height: 70in. Weight: Weight: 112.2kg. Weight: 246.8lb. Body mass index: BMI: 35.5kg/m^2. Body surface area: BSA: 2.92m^2. Blood pressure: 120/77. Patient status: Inpatient. Location: Bedside.  ------------------------------------------------------------  ------------------------------------------------------------ Left ventricle: The cavity size was normal. Wall thickness was increased in a pattern of mild LVH. Systolic function was vigorous. The estimated ejection fraction was in the range of 65% to 70%. Wall motion was normal; there were no regional wall motion abnormalities.  ------------------------------------------------------------ Aortic valve: Structurally normal valve. Trileaflet. Cusp separation was normal. Doppler: Transvalvular velocity was within the normal range. There was no stenosis. No regurgitation.  ------------------------------------------------------------ Aorta: Aortic root: The aortic root was normal in size. Ascending aorta: The ascending aorta was normal in size.  ------------------------------------------------------------ Mitral valve: Structurally normal valve. Leaflet separation was normal. Doppler: Transvalvular velocity  was within the normal range. There was no evidence for stenosis. No regurgitation. Peak gradient: 2mm Hg (D).  ------------------------------------------------------------ Left atrium: The atrium was normal in size.  ------------------------------------------------------------ Right ventricle: The cavity size was normal. Wall thickness was normal. Systolic function was  normal.  ------------------------------------------------------------ Pulmonic valve: Structurally normal valve. Cusp separation was normal. Doppler: Transvalvular velocity was within the normal range. No regurgitation.  ------------------------------------------------------------ Tricuspid valve: Structurally normal valve. Leaflet separation was normal. Doppler: Transvalvular velocity was within the normal range. No regurgitation.  ------------------------------------------------------------ Right atrium: The atrium was normal in size.  ------------------------------------------------------------ Pericardium: Mitral valve inflow pattern shows a greater than 50% variation with respiration .There is early diastolic collapse of right atrium. These findings are suggestive of early hemodynamic compromise. A moderate, free-flowing pericardial effusion was identified circumferential to the heart. The fluid had no internal echoes.  ------------------------------------------------------------ Systemic veins: Inferior vena cava: The vessel was dilated; the respirophasic diameter changes were blunted (< 50%); findings are consistent with elevated central venous pressure.  ------------------------------------------------------------ Pleura: There was a left pleural effusion.     Assessment/Plan:  Patient Active Hospital Problem List: Acute viral pericarditis (09/18/2012)   Assessment: Echo showed early hemodynamic compromise. Clinically today he is improved.  Pulse slower, BP good, no pulsus paradox on bedside exam. Chest xray this am appears improved to me.   Plan: Continue present meds.  Still mild volume overload. Continue low dose lasix po    LOS: 3 days    Cassell Clement 09/21/2012, 10:00 AM

## 2012-09-22 DIAGNOSIS — N179 Acute kidney failure, unspecified: Secondary | ICD-10-CM

## 2012-09-22 DIAGNOSIS — I319 Disease of pericardium, unspecified: Secondary | ICD-10-CM

## 2012-09-22 LAB — COMPREHENSIVE METABOLIC PANEL
ALT: 230 U/L — ABNORMAL HIGH (ref 0–53)
Alkaline Phosphatase: 176 U/L — ABNORMAL HIGH (ref 39–117)
BUN: 19 mg/dL (ref 6–23)
CO2: 29 mEq/L (ref 19–32)
Chloride: 97 mEq/L (ref 96–112)
GFR calc Af Amer: 52 mL/min — ABNORMAL LOW (ref >90)
GFR calc non Af Amer: 45 mL/min — ABNORMAL LOW (ref >90)
Glucose, Bld: 106 mg/dL — ABNORMAL HIGH (ref 70–99)
Potassium: 3.5 mEq/L (ref 3.5–5.1)
Sodium: 136 mEq/L (ref 135–145)
Total Bilirubin: 1.9 mg/dL — ABNORMAL HIGH (ref 0.3–1.2)

## 2012-09-22 LAB — HIV-1 RNA QUANT-NO REFLEX-BLD: HIV-1 RNA Quant, Log: <1.3 {Log} (ref <1.30)

## 2012-09-22 LAB — ANA: Anti Nuclear Antibody(ANA): NEGATIVE

## 2012-09-22 LAB — CULTURE, BLOOD (ROUTINE X 2)

## 2012-09-22 LAB — CMV IGM: CMV IgM: 0.3 (ref <0.90)

## 2012-09-22 LAB — ROCKY MTN SPOTTED FVR AB, IGG-BLOOD: RMSF IgG: 0.75 IV

## 2012-09-22 LAB — B. BURGDORFI ANTIBODIES: B burgdorferi Ab IgG+IgM: 0.46 {ISR}

## 2012-09-22 LAB — PARVOVIRUS B19 ANTIBODY, IGG AND IGM: Parovirus B19 IgG Abs: 0.2 index (ref <0.9)

## 2012-09-22 MED ORDER — SALINE SPRAY 0.65 % NA SOLN
1.0000 | NASAL | Status: DC | PRN
  Administered 2012-09-23: 1 via NASAL
  Filled 2012-09-22: qty 44

## 2012-09-22 MED ORDER — FUROSEMIDE 40 MG PO TABS
40.0000 mg | ORAL_TABLET | Freq: Once | ORAL | Status: AC
  Administered 2012-09-22: 40 mg via ORAL
  Filled 2012-09-22: qty 1

## 2012-09-22 NOTE — Progress Notes (Signed)
  Echocardiogram 2D Echocardiogram limited has been performed.  Michael [name] 09/22/2012, 10:11 AM

## 2012-09-22 NOTE — Progress Notes (Signed)
Patient Name: Michael [name]      SUBJECTIVE: eveetns noted with worsening chest pain with clinical diagnosis of pericarditis Colchicine added; effusion noted pt is on LMWH  Past Medical History  Diagnosis Date  . Elevated transaminase level/Chronic 09/17/2012    thes date back to age 31.   . Elevated blood pressure 09/17/2012  . Left ventricular hypertrophy 09/17/2012  . Acute pericarditis, unspecified 09/17/2012  . Obesity     on 09/18/12  BMI 35.6 weight 112.2 kg/247#    PHYSICAL EXAM Filed Vitals:   09/22/12 0025 09/22/12 0407 09/22/12 0500 09/22/12 0745  BP: 125/67 118/73  143/74  Pulse: 93 93  95  Temp: 99.1 F (37.3 C) 99.1 F (37.3 C)  98.5 F (36.9 C)  TempSrc: Oral Oral  Oral  Resp:    20  Height:      Weight:   245 lb 9.5 oz (111.4 kg)   SpO2: 97% 96%  93%   Well developed and nourished in no acute distress HENT normal Neck supple with JVP-flat Carotids brisk and full without bruits Clear Regular rate and rhythm, 3 component friction rubAbd-soft with active BS without hepatomegaly No Clubbing cyanosis edema Skin-warm and dry A & Oriented  Grossly normal sensory and motor function   TELEMETRY: Reviewed telemetry pt in sinus tach    Intake/Output Summary (Last 24 hours) at 09/22/12 0915 Last data filed at 09/21/12 1200  Gross per 24 hour  Intake      0 ml  Output    500 ml  Net   -500 ml    LABS: Basic Metabolic Panel:  Lab 09/22/12 1610 09/21/12 0600 09/20/12 0545 09/19/12 0520 09/18/12 1815 09/18/12 0551 09/18/12 0535 09/16/12 2238  NA 136 138 130* 134* -- 138 134* 137  K 3.5 3.7 4.0 4.3 -- 3.7 3.7 3.5  CL 97 100 95* 96 -- 100 97 99  CO2 29 26 24 24  -- -- 27 28  GLUCOSE 106* 99 110* 123* -- 163* 171* 107*  BUN 19 22 23  25* -- 12 13 10   CREATININE 1.92* 2.24* 2.33* 2.36* 1.86* 1.30 1.21 --  CALCIUM 9.3 9.3 -- -- -- -- -- --  MG -- -- -- -- -- -- -- --  PHOS -- -- -- -- -- -- -- --   Cardiac Enzymes:  Basename 09/19/12 1527  CKTOTAL  125  CKMB 3.5  CKMBINDEX --  TROPONINI --   CBC:  Lab 09/21/12 1045 09/21/12 0600 09/20/12 0545 09/18/12 1815 09/18/12 0551 09/18/12 0535 09/16/12 2238  WBC 8.9 8.7 10.6* 19.1* -- 11.3* 10.6*  NEUTROABS 6.2 6.2 -- -- -- 8.7* 7.4  HGB 11.0* 10.9* 12.0* 14.2 15.0 14.2 14.6  HCT 32.2* 32.5* 34.7* 40.8 44.0 40.7 41.2  MCV 86.8 86.4 86.1 87.0 -- 87.0 86.2  PLT 238 242 212 213 -- 175 172   PROTIME:  Basename 09/20/12 0545  LABPROT 16.8*  INR 1.40   Liver Function Tests:  Basename 09/22/12 0500 09/21/12 0600  AST 69* 131*  ALT 230* 298*  ALKPHOS 176* 159*  BILITOT 1.9* 2.5*  PROT 7.0 6.8  ALBUMIN 3.0* 2.9*   No results found for this basename: LIPASE:2,AMYLASE:2 in the last 72 hours   Basename 09/20/12 1150  TSH 9.958*  T4TOTAL --  T3FREE --  THYROIDAB --    ASSESSMENT AND PLAN:  Patient Active Hospital Problem List: Acute viral pericarditis (09/18/2012)   Elevated transaminase level/Chronic (09/17/2012)   Obstructive sleep apnea (09/17/2012)   Hypertension (  09/18/2012)   Acute kidney injury  Discussed with DrCD re kidney  She would like [place] to hold off on ersumption of NSAIDs until Cr normalizes  Not candidate for steroids as 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 i will look into the data on colchicine alone.   Will stop LMWH which might have aggravated the effusion and use SCDs  Signed, Sherryl Manges MD  09/22/2012

## 2012-09-22 NOTE — Progress Notes (Signed)
[facility]  Subjective:  Awake, alert, getting 2D echo currently No pleuritic chest pain.  No orthopnea, no purulent sputum Still on no antibiotics--T 102.5 at 3 PM and 100.3 at 7PM last night   Objective: Vital signs in last 24 hours: Blood pressure 143/74, pulse 95, temperature 98.5 F (36.9 C), temperature source Oral, resp. rate 20, height 5\' 10"  (1.778 m), weight 111.4 kg (245 lb 9.5 oz), SpO2 93.00%.    PHYSICAL EXAM General--as above Chest--crackles L base Heart--I don't hear rub Abd--nontender Extr--no edema  Lab Results:   Lab 09/22/12 0500 09/21/12 0600 09/20/12 0545  NA 136 138 130*  K 3.5 3.7 4.0  CL 97 100 95*  CO2 29 26 24   BUN 19 22 23   CREATININE 1.92* 2.24* 2.33*  ALB -- -- --  GLUCOSE 106* -- --  CALCIUM 9.3 9.3 9.1  PHOS -- -- --     Basename 09/21/12 1045 09/21/12 0600  WBC 8.9 8.7  HGB 11.0* 10.9*  HCT 32.2* 32.5*  PLT 238 242    Assessment/Plan: Renal function improving off NSAIDs, no further contrast, and no further hypotension.  Pulm infiltrates vs CHF on CXR.  Received furosemide 40 mg x 2 yesterday.  He has no purulent sputum and no elevation of WBC, but I wonder if he should be on antibiotics with T 102.5 yesterday and Bilat airspace densities seen on CXR yesterday (ID is following).  Will rx po furosemide again today.       LOS: 4 days   Teresa Lemmerman F 09/22/2012,10:02 AM   .labalb

## 2012-09-22 NOTE — Care Management Note (Addendum)
    Page 1 of 1   09/23/2012     3:18:15 PM   CARE MANAGEMENT NOTE 09/23/2012  Patient:  Michael [name], Michael [name]   Account Number:  [id number]  Date Initiated:  09/22/2012  Documentation initiated by:  Junius Creamer  Subjective/Objective Assessment:   adm w pericarditis     Action/Plan:   lives w wife   Anticipated DC Date:  09/24/2012   Anticipated DC Plan:  HOME/SELF CARE      DC Planning Services  CM consult      Choice offered to / List presented to:             Status of service:  In process, will continue to follow Medicare Important Message given?   (If response is "NO", the following Medicare IM given date fields will be blank) Date Medicare IM given:   Date Additional Medicare IM given:    Discharge Disposition:  HOME/SELF CARE  Per UR Regulation:  Reviewed for med. necessity/level of care/duration of stay  If discussed at Long Length of Stay Meetings, dates discussed:    Comments:  09/23/12 Taleyah Hillman,RN,BSN [phone] MET WITH PT AND WIFE TO DISCUSS DC PLANS.  WIFE TO PROVIDE CARE AT DC.  PT CONCERNED ABOUT WHEN HE CAN GO BACK TO WORK.  PT DENIES ANY DC NEEDS.  10/21 11:32a debbie dowell rn,bsn [phone]

## 2012-09-22 NOTE — Progress Notes (Signed)
[facility]    Subjective: He denies any current dypsnea, still with chest pain  Antibiotics: NONE Anti-infectives    None      Medications: Scheduled Meds:    . colchicine  0.6 mg Oral Daily  . furosemide  40 mg Oral Once  . magnesium hydroxide  30 mL Oral Once  . oxyCODONE-acetaminophen  1-2 tablet Oral Q8H  . pantoprazole  40 mg Oral Q0600  . sodium chloride  3 mL Intravenous Q12H  . DISCONTD: enoxaparin (LOVENOX) injection  40 mg Subcutaneous Q24H   Continuous Infusions:    . sodium chloride 20 mL/hr at 09/21/12 0600   PRN Meds:.sodium chloride, acetaminophen, ALPRAZolam, fentaNYL, ondansetron (ZOFRAN) IV, sodium chloride, zolpidem   Objective: Weight change: -14.1 oz (-0.4 kg)  Intake/Output Summary (Last 24 hours) at 09/22/12 1146 Last data filed at 09/22/12 1059  Gross per 24 hour  Intake    253 ml  Output    500 ml  Net   -247 ml   Blood pressure 143/74, pulse 95, temperature 98.5 F (36.9 C), temperature source Oral, resp. rate 20, height 5\' 10"  (1.778 m), weight 245 lb 9.5 oz (111.4 kg), SpO2 93.00%. Temp:  [98.5 F (36.9 C)-102.5 F (39.2 C)] 98.5 F (36.9 C) (10/21 0745) Pulse Rate:  [88-108] 95  (10/21 0745) Resp:  [20] 20  (10/21 0745) BP: (118-143)/(67-82) 143/74 mmHg (10/21 0745) SpO2:  [75 %-97 %] 93 % (10/21 0745) Weight:  [245 lb 9.5 oz (111.4 kg)] 245 lb 9.5 oz (111.4 kg) (10/21 0500)  Physical Exam: General: Alert and awake, oriented x3, on [place] HEENT: anicteric sclera CVS RRR,  no murmur rubs or gallops Chest: no crackles noted on my exam Abdomen: soft nontender, nondistended, normal bowel sounds,  Lab Results:  Basename 09/21/12 1045 09/21/12 0600  WBC 8.9 8.7  HGB 11.0* 10.9*  HCT 32.2* 32.5*  PLT 238 242    BMET  Basename 09/22/12 0500 09/21/12 0600  NA 136 138  K 3.5 3.7  CL 97 100  CO2 29 26  GLUCOSE 106* 99  BUN 19 22  CREATININE 1.92* 2.24*  CALCIUM 9.3 9.3    Micro  Results: Recent Results (from the past 240 hour(s))  CULTURE, BLOOD (ROUTINE X 2)     Status: Normal (Preliminary result)   Collection Time   09/18/12  9:20 PM      Component Value Range Status Comment   Specimen Description BLOOD RIGHT ARM   Final    Special Requests BOTTLES DRAWN AEROBIC AND ANAEROBIC 10CC   Final    Culture  Setup Time 09/19/2012 02:06   Final    Culture     Final    Value:        BLOOD CULTURE RECEIVED NO GROWTH TO DATE CULTURE WILL BE HELD FOR 5 DAYS BEFORE ISSUING A FINAL NEGATIVE REPORT   Report Status PENDING   Incomplete   CULTURE, BLOOD (ROUTINE X 2)     Status: Normal (Preliminary result)   Collection Time   09/18/12  9:25 PM      Component Value Range Status Comment   Specimen Description BLOOD RIGHT HAND   Final    Special Requests BOTTLES DRAWN AEROBIC AND ANAEROBIC 10CC   Final    Culture  Setup Time 09/19/2012 02:06   Final    Culture     Final    Value:        BLOOD CULTURE RECEIVED NO  GROWTH TO DATE CULTURE WILL BE HELD FOR 5 DAYS BEFORE ISSUING A FINAL NEGATIVE REPORT   Report Status PENDING   Incomplete   CULTURE, BLOOD (ROUTINE X 2)     Status: Normal (Preliminary result)   Collection Time   09/19/12  4:26 PM      Component Value Range Status Comment   Specimen Description BLOOD RIGHT HAND   Final    Special Requests BOTTLES DRAWN AEROBIC AND ANAEROBIC 10CC   Final    Culture  Setup Time 09/20/2012 01:38   Final    Culture     Final    Value: GRAM POSITIVE RODS     Note: Gram Stain Report Called to,Read Back By and Verified With: DANA SULLIVAN 12:20PM 09/21/12 BY GUSTK   Report Status PENDING   Incomplete   AFB CULTURE, BLOOD     Status: Normal (Preliminary result)   Collection Time   09/19/12  4:26 PM      Component Value Range Status Comment   Specimen Description BLOOD RIGHT HAND   Final    Special Requests ACID FAST BACILLI 5CC   Final    Culture     Final    Value: CULTURE WILL BE EXAMINED FOR 6 WEEKS BEFORE ISSUING A FINAL REPORT    Report Status PENDING   Incomplete   CULTURE, BLOOD (ROUTINE X 2)     Status: Normal (Preliminary result)   Collection Time   09/19/12  4:39 PM      Component Value Range Status Comment   Specimen Description BLOOD RIGHT ARM   Final    Special Requests BOTTLES DRAWN AEROBIC AND ANAEROBIC 10CC   Final    Culture  Setup Time 09/20/2012 02:06   Final    Culture     Final    Value:        BLOOD CULTURE RECEIVED NO GROWTH TO DATE CULTURE WILL BE HELD FOR 5 DAYS BEFORE ISSUING A FINAL NEGATIVE REPORT   Report Status PENDING   Incomplete   FUNGUS CULTURE, BLOOD     Status: Normal (Preliminary result)   Collection Time   09/19/12  4:39 PM      Component Value Range Status Comment   Specimen Description BLOOD RIGHT ARM   Final    Special Requests BOTTLES DRAWN AEROBIC AND ANAEROBIC 10CC   Final    Culture NO FUNGUS ISOLATED;CULTURE IN PROGRESS FOR 7 DAYS   Final    Report Status PENDING   Incomplete   MRSA PCR SCREENING     Status: Normal   Collection Time   09/20/12  8:36 PM      Component Value Range Status Comment   MRSA by PCR NEGATIVE  NEGATIVE Final     Studies/Results: Dg Chest 2 View  09/21/2012  *RADIOLOGY REPORT*  Clinical Data: Evaluate for edema versus infection  CHEST - 2 VIEW  Comparison: 09/20/2012  Findings: Heart size appears normal. There are bilateral pleural effusions left greater than right. Persistent bilateral airspace opacities are again noted, right greater than left.  Not significantly improved from previous exam.  IMPRESSION:  1.  No significant change in bilateral airspace opacities and pleural effusions.   Original Report Authenticated By: Rosealee Albee, M.D.    Dg Chest 2 View  09/20/2012  *RADIOLOGY REPORT*  Clinical Data: Shortness of breath.  CHEST - 2 VIEW  Comparison: Same day.  Findings: Mild bilateral pleural effusions are noted. Cardiomediastinal silhouette appears normal.  Right upper and  lower lobe opacities are again noted and unchanged compared to  prior exam.  Left basilar opacity is also noted which is unchanged.  IMPRESSION: Bilateral lung opacities and pleural effusions are again noted and unchanged; these findings consistent with bilateral pneumonia or possibly edema.   Original Report Authenticated By: Venita Sheffield., M.D.    Dg Chest Port 1 View  09/20/2012  *RADIOLOGY REPORT*  Clinical Data: Hypoxia  PORTABLE CHEST - 1 VIEW  Comparison: 09/18/2012  Findings: Interval development of extensive bilateral airspace disease in the lung bases.  This may be pneumonia or edema. Hypoventilation with decreased lung volume.  No significant pleural effusion.  IMPRESSION: Interval development of extensive bibasilar airspace disease which may represent pneumonia or pulmonary edema.   Original Report Authenticated By: Camelia Phenes, M.D.       Assessment/Plan: Michael [name] is a 31 y.o. 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  1) Pericarditis: He has multiple serologies, cultures pending.FLU pcr negative.  ANA pending, RMSF, HIV RNA.  Likely viral etiology vs SLE, other.    2) Fever - 102.5 yesterday, none since.  No obvious etiology of infection.  I do not suspect pneumonia with improving respiratory status with diuresis, no cough, no leukocytosis, no productive sputum.  I do not see a role for empiric antibiotics with likely no benefit and possible side effects.  Other causes of fever include drug reaction.  He is on protonix (not a home med) because of concurrent NSAIDS since admission and is known to cause fever.  It also can lead to C. Diff.  I do not think colchicine has the same effect requiring ppi co-administration? -d/c Protonix if not needed.      3) Transaminitis - continuing to improve.   4) GPR in 1/2 blood culture undoubtedly a diptheroid= contaminant     LOS: 4 days   Mallorie Norrod,  Swathi Dauphin 09/22/2012, 11:46 AM

## 2012-09-22 NOTE — Progress Notes (Signed)
[facility] Daily Rounding Note 09/22/2012, 8:34 AM  SUBJECTIVE:       Feels better.  No longer having inspirational chest pain.  Breathing is easier, no DOE.  Eating well.  Had BM this morning.   OBJECTIVE:         Vital signs in last 24 hours:    Temp:  [98.5 F (36.9 C)-102.5 F (39.2 C)] 98.5 F (36.9 C) (10/21 0745) Pulse Rate:  [88-108] 95  (10/21 0745) Resp:  [20-21] 20  (10/21 0745) BP: (118-143)/(67-87) 143/74 mmHg (10/21 0745) SpO2:  [75 %-97 %] 93 % (10/21 0745) Weight:  [111.4 kg (245 lb 9.5 oz)] 111.4 kg (245 lb 9.5 oz) (10/21 0500) Last BM Date: 09/18/12 General: Looks well, just slightly icteric   Heart: RRR Chest: clear B Abdomen: soft, NT, ND.  Active BS.  No organomegaly  Extremities: no pedal edema.  Bruising and swelling at right antecubitum from multiple blood draws.  Neuro/Psych:  Pleasant, no tremor, not confused, engaged, in good spirits.   Intake/Output from previous day: 10/20 0701 - 10/21 0700 In: -  Out: 500 [Urine:500]  Lab Results:  Basename 09/21/12 1045 09/21/12 0600 09/20/12 0545  WBC 8.9 8.7 10.6*  HGB 11.0* 10.9* 12.0*  HCT 32.2* 32.5* 34.7*  PLT 238 242 212   BMET  Basename 09/22/12 0500 09/21/12 0600 09/20/12 0545  NA 136 138 130*  K 3.5 3.7 4.0  CL 97 100 95*  CO2 29 26 24   GLUCOSE 106* 99 110*  BUN 19 22 23   CREATININE 1.92* 2.24* 2.33*  CALCIUM 9.3 9.3 9.1   LFT  Basename 09/22/12 0500 09/21/12 0600 09/20/12 0545  PROT 7.0 6.8 6.8  ALBUMIN 3.0* 2.9* 3.1*  AST 69* 131* 183*  ALT 230* 298* 382*  ALKPHOS 176* 159* 176*  BILITOT 1.9* 2.5* 4.2*  BILIDIR -- -- --  IBILI -- -- --   PT/INR  Basename 09/20/12 0545  LABPROT 16.8*  INR 1.40   Hepatitis Panel   Ref. Range 09/18/2012 14:38  Hep A IgM Latest Range: NEGATIVE  NEGATIVE  Hepatitis B Surface Ag Latest Range: NEGATIVE  NEGATIVE  Hep B C IgM Latest Range: NEGATIVE  NEGATIVE  HCV Ab Latest Range: NEGATIVE  NEGATIVE    Studies/Results: Dg Chest 2  View 09/21/2012  *RADIOLOGY REPORT*  Clinical Data: Evaluate for edema versus infection  CHEST - 2 VIEW  Comparison: 09/20/2012  Findings: Heart size appears normal. There are bilateral pleural effusions left greater than right. Persistent bilateral airspace opacities are again noted, right greater than left.  Not significantly improved from previous exam.  IMPRESSION:  1.  No significant change in bilateral airspace opacities and pleural effusions.   Original Report Authenticated By: Rosealee Albee, M.D.    Dg Chest 2 View 09/20/2012  *RADIOLOGY REPORT*  Clinical Data: Shortness of breath.  CHEST - 2 VIEW  Comparison: Same day.  Findings: Mild bilateral pleural effusions are noted. Cardiomediastinal silhouette appears normal.  Right upper and lower lobe opacities are again noted and unchanged compared to prior exam.  Left basilar opacity is also noted which is unchanged.  IMPRESSION: Bilateral lung opacities and pleural effusions are again noted and unchanged; these findings consistent with bilateral pneumonia or possibly edema.   Original Report Authenticated By: Venita Sheffield., M.D.     ASSESMENT: *  Acute pericarditis (viral?) with pericardial effusion.  Multiple serologies are pending for various viral organisms as well as markers for autoimmune  disease.  *  Acute cholestasis.  Long hx (back to teen years) of abnormal liver tests, baseline LFTs not known. ABC serologies negative.  ANA, AMA still pending from 10/18.  Ceruloplasmin normal at 44.  GB/biliary/liver ultrasound negative. LFTs are normalizing.    PLAN: *  Has ROV with Dr Christella Hartigan for 11/26 with previsit LFTs planned for the 25th. *  Note Dr Comer's interest in stopping Protonix.  Pt not using this PTA, not having active GERD sxs, no longer using NSAIDs, so I agree it is unnecessary and will stop this *  GI will sign off   LOS: 4 days   Jennye Moccasin  09/22/2012, 8:34 AM Pager: [phone]   Patient seen and I agree with the  above documentation including the assessment and plan

## 2012-09-23 LAB — EBV AB TO VIRAL CAPSID AG PNL, IGG+IGM
EBV VCA IgG: 66.6 U/mL — ABNORMAL HIGH (ref <18.0)
EBV VCA IgM: <10 U/mL (ref <36.0)

## 2012-09-23 LAB — BASIC METABOLIC PANEL
Calcium: 9.6 mg/dL (ref 8.4–10.5)
Creatinine, Ser: 1.77 mg/dL — ABNORMAL HIGH (ref 0.50–1.35)
GFR calc non Af Amer: 49 mL/min — ABNORMAL LOW (ref >90)
Sodium: 138 mEq/L (ref 135–145)

## 2012-09-23 LAB — T4, FREE: Free T4: 1.1 ng/dL (ref 0.80–1.80)

## 2012-09-23 MED ORDER — MAGNESIUM HYDROXIDE 400 MG/5ML PO SUSP
30.0000 mL | Freq: Every day | ORAL | Status: DC | PRN
  Administered 2012-09-25: 30 mL via ORAL
  Filled 2012-09-23: qty 30

## 2012-09-23 MED ORDER — FUROSEMIDE 40 MG PO TABS
40.0000 mg | ORAL_TABLET | Freq: Once | ORAL | Status: AC
  Administered 2012-09-23: 40 mg via ORAL
  Filled 2012-09-23: qty 1

## 2012-09-23 MED ORDER — COLCHICINE 0.6 MG PO TABS
0.6000 mg | ORAL_TABLET | Freq: Two times a day (BID) | ORAL | Status: DC
  Administered 2012-09-23 – 2012-09-25 (×4): 0.6 mg via ORAL
  Filled 2012-09-23 (×5): qty 1

## 2012-09-23 NOTE — Progress Notes (Signed)
[facility]  Subjective:  Awake, alert, no pleuritic chest pain Wife at bedside   Objective: Vital signs in last 24 hours: Blood pressure 124/64, pulse 98, temperature 99.8 F (37.7 C), temperature source Oral, resp. rate 18, height 5\' 10"  (1.778 m), weight 110.179 kg (242 lb 14.4 oz), SpO2 96.00%.    PHYSICAL EXAM General--as above Chest--clear Heart--systolic murmur, no rub Abd--nontender Extr--trace edema  Weight  112.2 kg on 17 Oct--110.2 kg today  Lab Results:   Lab 09/23/12 0645 09/22/12 0500 09/21/12 0600  NA 138 136 138  K 3.9 3.5 3.7  CL 97 97 100  CO2 30 29 26   BUN 19 19 22   CREATININE 1.77* 1.92* 2.24*  ALB -- -- --  GLUCOSE 125* -- --  CALCIUM 9.6 9.3 9.3  PHOS -- -- --     Basename 09/21/12 1045 09/21/12 0600  WBC 8.9 8.7  HGB 11.0* 10.9*  HCT 32.2* 32.5*  PLT 238 242   I have reviewed the patient's current medications.   Assessment/Plan: Renal function improving off NSAIDs, no further contrast, and no further hypotension. Pulm infiltrates vs CHF on CXR. Received furosemide 40 mg x 2 yesterday.Will rx po furosemide again today. Renal function improving--I'll sign off.  Please call if renal help needed again     LOS: 5 days   Klever Twyford F 09/23/2012,9:35 AM   .labalb

## 2012-09-23 NOTE — ED Provider Notes (Signed)
Medical screening examination/treatment/procedure(s) were conducted as a shared visit with non-physician practitioner(s) and myself.  I personally evaluated the patient during the encounter   Michael Sprout, MD 09/23/12 [phone]

## 2012-09-23 NOTE — Progress Notes (Signed)
Pt tolerated ambulation in hallway over 700 ft maintaining O2  Sat on RA 89- 92%  Pt complaint of being tired after walk. Michael [name] A

## 2012-09-23 NOTE — Progress Notes (Signed)
Pt had temp of 101.8 this morning. Scheduled dose of Percocet was due so this was given to help relieve fever. Recheck of temperature is 99.8.

## 2012-09-23 NOTE — Progress Notes (Signed)
Patient Name: Michael [name]      SUBJECTIVE: chest pain mostly gone, no sob  Past Medical History  Diagnosis Date  . Elevated transaminase level/Chronic 09/17/2012    thes date back to age 31.   . Elevated blood pressure 09/17/2012  . Left ventricular hypertrophy 09/17/2012  . Acute pericarditis, unspecified 09/17/2012  . Obesity     on 09/18/12  BMI 35.6 weight 112.2 kg/247#    PHYSICAL EXAM Filed Vitals:   09/22/12 2010 09/23/12 0453 09/23/12 0500 09/23/12 0619  BP: 117/68 124/64    Pulse: 82 98    Temp: 98.1 F (36.7 C) 101.8 F (38.8 C)  99.8 F (37.7 C)  TempSrc: Oral Oral    Resp: 18 18    Height:      Weight:   242 lb 14.4 oz (110.179 kg)   SpO2: 100% 96%      Well developed and nourished in no acute distress HENT normal Neck supple with JVP-flat Clear Regular rate and rhythm,3 component rub Abd-soft with active BS No Clubbing cyanosis edema Skin-warm and dry A & Oriented  Grossly normal sensory and motor function  TELEMETRY: Reviewed telemetry pt in sinus:    Intake/Output Summary (Last 24 hours) at 09/23/12 0828 Last data filed at 09/22/12 1059  Gross per 24 hour  Intake    253 ml  Output      0 ml  Net    253 ml    LABS: Basic Metabolic Panel:  Lab 09/23/12 1610 09/22/12 0500 09/21/12 0600 09/20/12 0545 09/19/12 0520 09/18/12 1815 09/18/12 0551 09/18/12 0535 09/16/12 2238  NA 138 136 138 130* 134* -- 138 134* --  K 3.9 3.5 3.7 4.0 4.3 -- 3.7 3.7 --  CL 97 97 100 95* 96 -- 100 97 --  CO2 30 29 26 24 24  -- -- 27 28  GLUCOSE 125* 106* 99 110* 123* -- 163* 171* --  BUN 19 19 22 23  25* -- 12 13 --  CREATININE 1.77* 1.92* 2.24* 2.33* 2.36* 1.86* 1.30 -- --  CALCIUM 9.6 9.3 -- -- -- -- -- -- --  MG -- -- -- -- -- -- -- -- --  PHOS -- -- -- -- -- -- -- -- --   Cardiac Enzymes: No results found for this basename: CKTOTAL:3,CKMB:3,CKMBINDEX:3,TROPONINI:3 in the last 72 hours CBC:  Lab 09/21/12 1045 09/21/12 0600 09/20/12 0545 09/18/12 1815  09/18/12 0551 09/18/12 0535 09/16/12 2238  WBC 8.9 8.7 10.6* 19.1* -- 11.3* 10.6*  NEUTROABS 6.2 6.2 -- -- -- 8.7* 7.4  HGB 11.0* 10.9* 12.0* 14.2 15.0 14.2 14.6  HCT 32.2* 32.5* 34.7* 40.8 44.0 40.7 41.2  MCV 86.8 86.4 86.1 87.0 -- 87.0 86.2  PLT 238 242 212 213 -- 175 172   PROTIME: No results found for this basename: LABPROT:3,INR:3 in the last 72 hours Liver Function Tests:  Basename 09/22/12 0500 09/21/12 0600  AST 69* 131*  ALT 230* 298*  ALKPHOS 176* 159*  BILITOT 1.9* 2.5*  PROT 7.0 6.8  ALBUMIN 3.0* 2.9*     Basename 09/20/12 1150  TSH 9.958*  T4TOTAL --  T3FREE --  THYROIDAB --    ECHo concerning for smaller?effusion.  Also organizing?clot  ASSESSMENT AND PLAN:  Patient Active Hospital Problem List: Acute viral pericarditis (09/18/2012)   Elevated transaminase level/Chronic (09/17/2012)   Obstructive sleep apnea (09/17/2012)   Hypertension (09/18/2012)   Acute kidney failure, unspecified (09/22/2012)   Improving renal fucntion   European guidelines describe colchine alone,  initially started and bid and continued daily for 3 months with reducrtion of recurrent pericarditis from30-50% range to 10-20%   Discussed with pt and family Still with nocturnal fever>>follwo  Signed, Sherryl Manges MD  09/23/2012

## 2012-09-23 NOTE — Progress Notes (Signed)
[facility]    Subjective: He denies any current dypsnea, still with chest pain  Antibiotics: NONE Anti-infectives    None      Medications: Scheduled Meds:    . colchicine  0.6 mg Oral BID  . furosemide  40 mg Oral Once  . oxyCODONE-acetaminophen  1-2 tablet Oral Q8H  . sodium chloride  3 mL Intravenous Q12H  . DISCONTD: colchicine  0.6 mg Oral Daily   Continuous Infusions:    . sodium chloride 20 mL/hr at 09/21/12 0600   PRN Meds:.sodium chloride, acetaminophen, ALPRAZolam, fentaNYL, ondansetron (ZOFRAN) IV, sodium chloride, sodium chloride, zolpidem   Objective: Weight change: -2 lb 11.1 oz (-1.221 kg)  Intake/Output Summary (Last 24 hours) at 09/23/12 1452 Last data filed at 09/23/12 1300  Gross per 24 hour  Intake    600 ml  Output      0 ml  Net    600 ml   Blood pressure 130/72, pulse 96, temperature 99.2 F (37.3 C), temperature source Oral, resp. rate 18, height 5\' 10"  (1.778 m), weight 242 lb 14.4 oz (110.179 kg), SpO2 97.00%. Temp:  [98.1 F (36.7 C)-101.8 F (38.8 C)] 99.2 F (37.3 C) (10/22 1405) Pulse Rate:  [82-98] 96  (10/22 1405) Resp:  [18] 18  (10/22 1405) BP: (117-130)/(64-72) 130/72 mmHg (10/22 1405) SpO2:  [96 %-100 %] 97 % (10/22 1405) Weight:  [242 lb 14.4 oz (110.179 kg)] 242 lb 14.4 oz (110.179 kg) (10/22 0500)  Physical Exam: General: Alert and awake, oriented x3, on RA CVS RRR,  no murmur rubs or gallops Chest: no crackles noted on my exam Abdomen: soft nontender, nondistended, normal bowel sounds,  Lab Results:  Basename 09/21/12 1045 09/21/12 0600  WBC 8.9 8.7  HGB 11.0* 10.9*  HCT 32.2* 32.5*  PLT 238 242    BMET  Basename 09/23/12 0645 09/22/12 0500  NA 138 136  K 3.9 3.5  CL 97 97  CO2 30 29  GLUCOSE 125* 106*  BUN 19 19  CREATININE 1.77* 1.92*  CALCIUM 9.6 9.3    Micro Results: Recent Results (from the past 240 hour(s))  CULTURE, BLOOD (ROUTINE X 2)     Status: Normal  (Preliminary result)   Collection Time   09/18/12  9:20 PM      Component Value Range Status Comment   Specimen Description BLOOD RIGHT ARM   Final    Special Requests BOTTLES DRAWN AEROBIC AND ANAEROBIC 10CC   Final    Culture  Setup Time 09/19/2012 02:06   Final    Culture     Final    Value:        BLOOD CULTURE RECEIVED NO GROWTH TO DATE CULTURE WILL BE HELD FOR 5 DAYS BEFORE ISSUING A FINAL NEGATIVE REPORT   Report Status PENDING   Incomplete   CULTURE, BLOOD (ROUTINE X 2)     Status: Normal (Preliminary result)   Collection Time   09/18/12  9:25 PM      Component Value Range Status Comment   Specimen Description BLOOD RIGHT HAND   Final    Special Requests BOTTLES DRAWN AEROBIC AND ANAEROBIC 10CC   Final    Culture  Setup Time 09/19/2012 02:06   Final    Culture     Final    Value:        BLOOD CULTURE RECEIVED NO GROWTH TO DATE CULTURE WILL BE HELD FOR 5 DAYS BEFORE ISSUING A FINAL NEGATIVE  REPORT   Report Status PENDING   Incomplete   CULTURE, BLOOD (ROUTINE X 2)     Status: Normal   Collection Time   09/19/12  4:26 PM      Component Value Range Status Comment   Specimen Description BLOOD RIGHT HAND   Final    Special Requests BOTTLES DRAWN AEROBIC AND ANAEROBIC 10CC   Final    Culture  Setup Time 09/20/2012 01:38   Final    Culture     Final    Value: DIPHTHEROIDS(CORYNEBACTERIUM SPECIES)     Note: Standardized susceptibility testing for this organism is not available.     Note: Gram Stain Report Called to,Read Back By and Verified With: DANA SULLIVAN 12:20PM 09/21/12 BY GUSTK   Report Status 09/22/2012 FINAL   Final   AFB CULTURE, BLOOD     Status: Normal (Preliminary result)   Collection Time   09/19/12  4:26 PM      Component Value Range Status Comment   Specimen Description BLOOD RIGHT HAND   Final    Special Requests ACID FAST BACILLI 5CC   Final    Culture     Final    Value: CULTURE WILL BE EXAMINED FOR 6 WEEKS BEFORE ISSUING A FINAL REPORT   Report Status  PENDING   Incomplete   CULTURE, BLOOD (ROUTINE X 2)     Status: Normal (Preliminary result)   Collection Time   09/19/12  4:39 PM      Component Value Range Status Comment   Specimen Description BLOOD RIGHT ARM   Final    Special Requests BOTTLES DRAWN AEROBIC AND ANAEROBIC 10CC   Final    Culture  Setup Time 09/20/2012 02:06   Final    Culture     Final    Value:        BLOOD CULTURE RECEIVED NO GROWTH TO DATE CULTURE WILL BE HELD FOR 5 DAYS BEFORE ISSUING A FINAL NEGATIVE REPORT   Report Status PENDING   Incomplete   FUNGUS CULTURE, BLOOD     Status: Normal (Preliminary result)   Collection Time   09/19/12  4:39 PM      Component Value Range Status Comment   Specimen Description BLOOD RIGHT ARM   Final    Special Requests BOTTLES DRAWN AEROBIC AND ANAEROBIC 10CC   Final    Culture NO FUNGUS ISOLATED;CULTURE IN PROGRESS FOR 7 DAYS   Final    Report Status PENDING   Incomplete   MRSA PCR SCREENING     Status: Normal   Collection Time   09/20/12  8:36 PM      Component Value Range Status Comment   MRSA by PCR NEGATIVE  NEGATIVE Final     Studies/Results: No results found.    Assessment/Plan: Michael [name] is a 31 y.o. male with chronic transaminitis, pericarditis with effusion.    1) Pericarditis: work up negative.  No obvious etiology.  Pleural effusion but no signs of pulmonary infection.    2) Fever - continues to have fever.  Protonix stopped.  No obvious bacterial source, likely related to # 1 vs drug fever. Will continue to monitor.    3) Transaminitis - continuing to improve.   4) GPR in 1/2 blood culture diptheroid= contaminant     LOS: 5 days   Thurman Sarver 09/23/2012, 2:52 PM

## 2012-09-24 ENCOUNTER — Ambulatory Visit: Payer: BC Managed Care – PPO | Admitting: Physician Assistant

## 2012-09-24 DIAGNOSIS — I319 Disease of pericardium, unspecified: Secondary | ICD-10-CM

## 2012-09-24 LAB — BASIC METABOLIC PANEL
CO2: 30 mEq/L (ref 19–32)
Calcium: 9.9 mg/dL (ref 8.4–10.5)
Creatinine, Ser: 1.74 mg/dL — ABNORMAL HIGH (ref 0.50–1.35)
GFR calc non Af Amer: 50 mL/min — ABNORMAL LOW (ref >90)
Sodium: 148 mEq/L — ABNORMAL HIGH (ref 135–145)

## 2012-09-24 LAB — T3, FREE: T3, Free: 2.8 pg/mL (ref 2.3–4.2)

## 2012-09-24 NOTE — Progress Notes (Signed)
Patient Name: Michael [name]      SUBJECTIVE: chest pain mostly gone, no sob  Past Medical History  Diagnosis Date  . Elevated transaminase level/Chronic 09/17/2012    thes date back to age 31.   . Elevated blood pressure 09/17/2012  . Left ventricular hypertrophy 09/17/2012  . Acute pericarditis, unspecified 09/17/2012  . Obesity     on 09/18/12  BMI 35.6 weight 112.2 kg/247#    PHYSICAL EXAM Filed Vitals:   09/23/12 0619 09/23/12 1405 09/23/12 2009 09/24/12 0509  BP:  130/72 111/67 121/72  Pulse:  96 91 92  Temp: 99.8 F (37.7 C) 99.2 F (37.3 C) 99.9 F (37.7 C) 100.4 F (38 C)  TempSrc:  Oral Oral Oral  Resp:  18 26 24   Height:      Weight:    237 lb 14 oz (107.9 kg)  SpO2:  97% 96% 93%    Well developed and nourished in no acute distress HENT normal Neck supple with JVP-flat Clear Regular rate and rhythm,3 component rub quieter Abd-soft with active BS No Clubbing cyanosis edema Skin-warm and dry A & Oriented  Grossly normal sensory and motor function  TELEMETRY: Reviewed telemetry pt in sinus:    Intake/Output Summary (Last 24 hours) at 09/24/12 0838 Last data filed at 09/24/12 0744  Gross per 24 hour  Intake    600 ml  Output    850 ml  Net   -250 ml    LABS: Basic Metabolic Panel:  Lab 09/24/12 1610 09/23/12 0645 09/22/12 0500 09/21/12 0600 09/20/12 0545 09/19/12 0520 09/18/12 1815 09/18/12 0551 09/18/12 0535  NA 148* 138 136 138 130* 134* -- 138 --  K 5.5* 3.9 3.5 3.7 4.0 4.3 -- 3.7 --  CL 103 97 97 100 95* 96 -- 100 --  CO2 30 30 29 26 24 24  -- -- 27  GLUCOSE 115* 125* 106* 99 110* 123* -- 163* --  BUN 20 19 19 22 23  25* -- 12 --  CREATININE 1.74* 1.77* 1.92* 2.24* 2.33* 2.36* 1.86* -- --  CALCIUM 9.9 9.6 -- -- -- -- -- -- --  MG -- -- -- -- -- -- -- -- --  PHOS -- -- -- -- -- -- -- -- --   Cardiac Enzymes: No results found for this basename: CKTOTAL:3,CKMB:3,CKMBINDEX:3,TROPONINI:3 in the last 72 hours CBC:  Lab 09/21/12 1045  09/21/12 0600 09/20/12 0545 09/18/12 1815 09/18/12 0551 09/18/12 0535  WBC 8.9 8.7 10.6* 19.1* -- 11.3*  NEUTROABS 6.2 6.2 -- -- -- 8.7*  HGB 11.0* 10.9* 12.0* 14.2 15.0 14.2  HCT 32.2* 32.5* 34.7* 40.8 44.0 40.7  MCV 86.8 86.4 86.1 87.0 -- 87.0  PLT 238 242 212 213 -- 175   PROTIME: No results found for this basename: LABPROT:3,INR:3 in the last 72 hours Liver Function Tests:  Basename 09/22/12 0500  AST 69*  ALT 230*  ALKPHOS 176*  BILITOT 1.9*  PROT 7.0  ALBUMIN 3.0*    No results found for this basename: TSH,T4TOTAL,FREET3,T3FREE,THYROIDAB in the last 72 hours  ECHo concerning for smaller?effusion.  Also organizing?clot  ASSESSMENT AND PLAN:  Patient Active Hospital Problem List: Acute viral pericarditis (09/18/2012)   Elevated transaminase level/Chronic (09/17/2012)   Obstructive sleep apnea (09/17/2012)   Hypertension (09/18/2012)   Acute kidney failure, unspecified (09/22/2012)   Improving renal fucntion   [place] guidelines describe colchine alone, initially started and bid  for 3 months >75kg  with reducrtion of recurrent pericarditis from30-50% range to 10-20%  Discussed with pt and family nocutrnal fever batter NA and K elevated ?? Rarely assoc w colchicine Recheck in am Recheck echo today  Signed, Sherryl Manges MD  09/24/2012

## 2012-09-24 NOTE — Progress Notes (Signed)
     Patient: Michael [name] Date of Encounter: 09/24/2012, 8:29 AM Admit date: 09/18/2012     Subjective  Michael [name] has no complaints this AM. He denies CP or SOB.   Objective  Physical Exam: Vitals: BP 121/72  Pulse 92  Temp 100.4 F (38 C) (Oral)  Resp 24  Ht 5\' 10"  (1.778 m)  Wt 237 lb 14 oz (107.9 kg)  BMI 34.13 kg/m2  SpO2 93% General: Well developed, well appearing 31 year old male in no acute distress. Neck: Supple. JVD not elevated. Lungs: Clear bilaterally to auscultation without wheezes, rales, or rhonchi. Breathing is unlabored. Heart: RRR S1 S2 without murmurs, rubs, or gallops.  Abdomen: Soft, non-distended. Extremities: No clubbing or cyanosis. No edema.  Distal pedal pulses are 2+ and equal bilaterally. Neuro: Alert and oriented X 3. Moves all extremities spontaneously. No focal deficits.  Intake/Output:  Intake/Output Summary (Last 24 hours) at 09/24/12 0829 Last data filed at 09/24/12 0744  Gross per 24 hour  Intake    600 ml  Output    850 ml  Net   -250 ml    Inpatient Medications:     . colchicine  0.6 mg Oral BID  . furosemide  40 mg Oral Once  . oxyCODONE-acetaminophen  1-2 tablet Oral Q8H  . sodium chloride  3 mL Intravenous Q12H  . DISCONTD: colchicine  0.6 mg Oral Daily      . sodium chloride 20 mL/hr at 09/21/12 0600    Labs:  [facility] 09/24/12 0535 09/23/12 0645  NA 148* 138  K 5.5* 3.9  CL 103 97  CO2 30 30  GLUCOSE 115* 125*  BUN 20 19  CREATININE 1.74* 1.77*  CALCIUM 9.9 9.6  MG -- --  PHOS -- --    Basename 09/22/12 0500  AST 69*  ALT 230*  ALKPHOS 176*  BILITOT 1.9*  PROT 7.0  ALBUMIN 3.0*   No results found for this basename: LIPASE:2,AMYLASE:2 in the last 72 hours  Basename 09/21/12 1045  WBC 8.9  NEUTROABS 6.2  HGB 11.0*  HCT 32.2*  MCV 86.8  PLT 238    Radiology/Studies: Dg Chest 2 View  09/21/2012  *RADIOLOGY REPORT*  Clinical Data: Evaluate for edema versus infection  CHEST - 2 VIEW   Comparison: 09/20/2012  Findings: Heart size appears normal. There are bilateral pleural effusions left greater than right. Persistent bilateral airspace opacities are again noted, right greater than left.  Not significantly improved from previous exam.  IMPRESSION:  1.  No significant change in bilateral airspace opacities and pleural effusions.   Original Report Authenticated By: Rosealee Albee, M.D.     Telemetry: normal sinus rhythm and intermittent sinus tachycardia; no significant arrhythmias   Assessment and Plan  1. Acute pericarditis - continue colchicine with close watch over renal and hepatic function; appreciate infectious disease help; continues to have fever 2. Hyperkalemia  3. Acute renal failure - serum Cr improving off NSAIDs, although not normalized yet; will continue to follow 4. Elevated transaminases, chronic  Dr. Graciela Husbands to see Signed, Rick Duff PA-C

## 2012-09-24 NOTE — Progress Notes (Signed)
*  PRELIMINARY RESULTS* Echocardiogram 2D Echocardiogram has been performed.  Michael [name] 09/24/2012, 12:14 PM

## 2012-09-25 DIAGNOSIS — I300 Acute nonspecific idiopathic pericarditis: Secondary | ICD-10-CM

## 2012-09-25 DIAGNOSIS — I319 Disease of pericardium, unspecified: Secondary | ICD-10-CM

## 2012-09-25 LAB — CULTURE, BLOOD (ROUTINE X 2)
Culture: NO GROWTH
Culture: NO GROWTH

## 2012-09-25 LAB — BASIC METABOLIC PANEL
CO2: 28 mEq/L (ref 19–32)
Calcium: 9.7 mg/dL (ref 8.4–10.5)
Creatinine, Ser: 1.65 mg/dL — ABNORMAL HIGH (ref 0.50–1.35)
GFR calc non Af Amer: 54 mL/min — ABNORMAL LOW (ref >90)
Glucose, Bld: 122 mg/dL — ABNORMAL HIGH (ref 70–99)
Sodium: 136 mEq/L (ref 135–145)

## 2012-09-25 MED ORDER — COLCHICINE 0.6 MG PO TABS: 0.6000 mg | ORAL_TABLET | Freq: Two times a day (BID) | ORAL | Status: DC

## 2012-09-25 MED ORDER — OXYCODONE-ACETAMINOPHEN 5-325 MG PO TABS: 1.0000 | ORAL_TABLET | Freq: Three times a day (TID) | ORAL | Status: DC

## 2012-09-25 MED ORDER — IBUPROFEN 200 MG PO TABS: 600.0000 mg | ORAL_TABLET | Freq: Four times a day (QID) | ORAL | Status: DC | PRN

## 2012-09-25 NOTE — Progress Notes (Signed)
  Patient Name: Michael [name]      SUBJECTIVE: denies sob or chest pain,no cough no soreness  Past Medical History  Diagnosis Date  . Elevated transaminase level/Chronic 09/17/2012    thes date back to age 31.   . Elevated blood pressure 09/17/2012  . Left ventricular hypertrophy 09/17/2012  . Acute pericarditis, unspecified 09/17/2012  . Obesity     on 09/18/12  BMI 35.6 weight 112.2 kg/247#    PHYSICAL EXAM Filed Vitals:   09/24/12 0509 09/24/12 1500 09/24/12 2008 09/25/12 0509  BP: 121/72 119/67 129/75 135/68  Pulse: 92 90 96 112  Temp: 100.4 F (38 C) 99 F (37.2 C) 100.4 F (38 C) 101.6 F (38.7 C)  TempSrc: Oral Oral Oral Oral  Resp: 24 20 19 20   Height:      Weight: 237 lb 14 oz (107.9 kg)     SpO2: 93% 95% 97% 96%   .Temp (24hrs), Avg:100.3 F (37.9 C), Min:99 F (37.2 C), Max:101.6 F (38.7 C)   Well developed and nourished in no acute distress HENT normal Neck supple with JVP-flat Clear Regular rate and rhythm, no murmurs or gallops  Rub gone Abd-soft with active BS No Clubbing cyanosis edema Skin-warm and dry A & Oriented  Grossly normal sensory and motor function    Intake/Output Summary (Last 24 hours) at 09/25/12 0756 Last data filed at 09/25/12 0510  Gross per 24 hour  Intake    240 ml  Output   1650 ml  Net  -1410 ml    LABS: Basic Metabolic Panel:  Lab 09/25/12 1610 09/24/12 0535 09/23/12 0645 09/22/12 0500 09/21/12 0600 09/20/12 0545 09/19/12 0520  NA 136 148* 138 136 138 130* 134*  K 4.0 5.5* 3.9 3.5 3.7 4.0 4.3  CL 96 103 97 97 100 95* 96  CO2 28 30 30 29 26 24 24   GLUCOSE 122* 115* 125* 106* 99 110* 123*  BUN 16 20 19 19 22 23  25*  CREATININE 1.65* 1.74* 1.77* 1.92* 2.24* 2.33* 2.36*  CALCIUM 9.7 9.9 -- -- -- -- --  MG -- -- -- -- -- -- --  PHOS -- -- -- -- -- -- --   Cardiac Enzymes: No results found for this basename: CKTOTAL:3,CKMB:3,CKMBINDEX:3,TROPONINI:3 in the last 72 hours CBC:  Lab 09/21/12 1045 09/21/12 0600  09/20/12 0545 09/18/12 1815  WBC 8.9 8.7 10.6* 19.1*  NEUTROABS 6.2 6.2 -- --  HGB 11.0* 10.9* 12.0* 14.2  HCT 32.2* 32.5* 34.7* 40.8  MCV 86.8 86.4 86.1 87.0  PLT 238 242 212 213     ASSESSMENT AND PLAN:  Patient Active Hospital Problem List: Acute viral pericarditis (09/18/2012)   Elevated transaminase level/Chronic (09/17/2012)   Obstructive sleep apnea (09/17/2012)   Hypertension (09/18/2012)   Acute kidney failure, unspecified (09/22/2012)   Elevated TSH >>nl free T3 and T4   Still febrile  Have asked ID to see agaiin   I am glad to send home today with ooutpt antipyretics   Colchince 0.6 bid x 3mon  Needs outpt sleep study   Signed, Sherryl Manges MD  09/25/2012

## 2012-09-25 NOTE — Discharge Summary (Signed)
CARDIOLOGY DISCHARGE SUMMARY   Patient ID: Michael [name] MRN: [id number] DOB/AGE: 05/18/1981 31 y.o.  Admit date: 09/18/2012 Discharge date: 09/25/2012  Primary Discharge Diagnosis: Principal Problem:  *Acute viral pericarditis with pericardial effusion  Secondary Discharge Diagnosis:  Elevated transaminase level/Chronic  Obstructive sleep apnea  Hypertension  Acute kidney failure, unspecified  Consults: ID, GI, Renal  Procedures: 2D echo and f/u echo  Hospital Course: Michael [name] is a 31 year old male with a recent diagnosis of pericarditis. He was seen in the office on 10/16 after he went to the ER. Dr Odessa Fleming initial plan was to manage him with NSAIDs as an outpatient but he developed fevers and increased pain. He came back to the ER and was admitted for further evaluation and treatment.  A 2D echocardiogram showed a moderate pericardial effusion. He was started on Colchicine for his pain plus narcotics. He had recurrent fevers and ID was asked to evaluate him. There was no obvious infectious etiology to the pericarditis/effusion. He also had pleural effusions without infection. It was felt the fevers were likely secondary to the pericarditis, no bacterial source identified. RMSF, B. Burgdorfi, HIV, CMV, mycoplasma and parvovirus were all negative. Blood cultures were negative as well. Echovirus is pending.  He was seen by GI for elevated transaminases. He has a long history of these but they were acutely worse. His ANA was negative. NSAIDs were discontinued for elevated LFTs and acute RI. He was seen by nephrology as well, but his Cr improved off NSAIDs and no further workup was done.  Michael [name] had a repeat echo on 10/23 that showed improvement in his pericardial effusion. His symptoms were improving on the colchicine and pain meds. His Cr is trending down, discharge labs listed below. Consideration can be given to a sleep study for OSA, but this will have to be arranged as an  outpatient. By 09/25/2012, Michael [name] had greatly improved. He was evaluated by Dr Graciela Husbands and by Dr Luciana Axe (ID) and considered stable for discharge, to follow up as an outpatient. He will also need a f/u chest CT in 3-6 months to follow up on nodes felt to be reactive/inflammatory, but should be followed. See CT report below.  Labs:   Lab Results  Component Value Date   WBC 8.9 09/21/2012   HGB 11.0* 09/21/2012   HCT 32.2* 09/21/2012   MCV 86.8 09/21/2012   PLT 238 09/21/2012     Lab 09/25/12 0525 09/22/12 0500  NA 136 --  K 4.0 --  CL 96 --  CO2 28 --  BUN 16 --  CREATININE 1.65* --  CALCIUM 9.7 --  PROT -- 7.0  BILITOT -- 1.9*  ALKPHOS -- 176*  ALT -- 230*  AST -- 69*  GLUCOSE 122* --   Lab Results  Component Value Date   CKTOTAL 125 09/19/2012   CKMB 3.5 09/19/2012   TROPONINI <0.30 09/18/2012      Radiology: Dg Chest 2 View 09/21/2012  *RADIOLOGY REPORT*  Clinical Data: Evaluate for edema versus infection  CHEST - 2 VIEW  Comparison: 09/20/2012  Findings: Heart size appears normal. There are bilateral pleural effusions left greater than right. Persistent bilateral airspace opacities are again noted, right greater than left.  Not significantly improved from previous exam.  IMPRESSION:  1.  No significant change in bilateral airspace opacities and pleural effusions.   Original Report Authenticated By: Rosealee Albee, M.D.    Dg Chest 2 View 09/20/2012  *RADIOLOGY REPORT*  Clinical  Data: Shortness of breath.  CHEST - 2 VIEW  Comparison: Same day.  Findings: Mild bilateral pleural effusions are noted. Cardiomediastinal silhouette appears normal.  Right upper and lower lobe opacities are again noted and unchanged compared to prior exam.  Left basilar opacity is also noted which is unchanged.  IMPRESSION: Bilateral lung opacities and pleural effusions are again noted and unchanged; these findings consistent with bilateral pneumonia or possibly edema.   Original Report  Authenticated By: Venita Sheffield., M.D.    Dg Chest 2 View 09/18/2012  *RADIOLOGY REPORT*  Clinical Data: Mid chest pain.  Shortness of breath.  Fever.  CHEST - 2 VIEW  Comparison: 09/18/2012 at 0557 hours the  Findings: Shallow inspiration.  Cardiac enlargement with borderline pulmonary vascularity.  Infiltration or atelectasis in the lung bases.  No blunting of costophrenic angles.  No pneumothorax.  IMPRESSION: Cardiac enlargement.  Shallow inspiration with infiltration or atelectasis in the lung bases.   Original Report Authenticated By: Marlon Pel, M.D.    Ct Angio Chest Pe W/cm &/or Wo Cm 09/17/2012  *RADIOLOGY REPORT*  Clinical Data: Low grade fever.  Pleurisy.  Rule out pulmonary embolism.  Tachycardia.  CT ANGIOGRAPHY CHEST  Technique:  Multidetector CT imaging of the chest using the standard protocol during bolus administration of intravenous contrast. Multiplanar reconstructed images including MIPs were obtained and reviewed to evaluate the vascular anatomy.  Contrast: OMNIPAQUE IOHEXOL 350 MG/ML SOLN  Comparison: Plain film of 1 day prior.  No prior CT.  Findings: Lung windows demonstrate minimal motion degradation. Nonspecific bronchial wall thickening to the right lower lobe. Minimal ground-glass opacity at the central right lower lobe is favored to be due to subsegmental atelectasis.  Soft tissue windows:  The quality of this exam for evaluation of pulmonary embolism is moderate.  In addition to mild motion artifact, the bolus is suboptimally timed, with a large amount contrast remaining in the SVC.  No evidence of pulmonary embolism to the large segmental level.  Bovine arch. Normal aortic caliber without dissection.  Mild cardiomegaly.  Trace right greater than left pleural fluid, possibly physiologic.  Upper normal subcarinal nodal tissue 1.1 cm. Mild azygo-esophageal recess adenopathy at 1.1 cm on image 39.  No hilar adenopathy. The lower thoracic esophagus is mildly dilated  with a fluid level within.  Residual thymic tissue in the anterior mediastinum.  Limited abdominal imaging demonstrates no significant findings.  No acute osseous abnormality.  IMPRESSION:  1.  Moderate quality exam.  No evidence of pulmonary embolism to the large segmental level. 2.  Mild cardiomegaly with trace bilateral pleural fluid.  This could be physiologic or relate to mild fluid overload. 3.  Prominent mediastinal nodes, favored to be reactive.  Consider follow-up chest CT at 3 - 6 months. 4.  Mildly dilated lower thoracic esophagus.  Question dysmotility or gastroesophageal reflux.   Original Report Authenticated By: Consuello Bossier, M.D.    US Abdomen Complete 09/18/2012  *RADIOLOGY REPORT*  Clinical Data:  Elevated LFTs.  COMPLETE ABDOMINAL ULTRASOUND  Comparison:  Chest CT 09/17/2012  Findings:  Gallbladder:  No stones or wall thickening.  Negative sonographic Murphy's.  Common bile duct:   Normal caliber, 3 mm.  Liver:  No focal lesion identified.  Within normal limits in parenchymal echogenicity.  IVC:  Appears normal.  Pancreas:  No focal abnormality seen.  Spleen:  Within normal limits in size and echotexture.  Right Kidney:   Normal in size and parenchymal echogenicity.  No  evidence of mass or hydronephrosis.  Left Kidney:  Normal in size and parenchymal echogenicity.  No evidence of mass or hydronephrosis.  Abdominal aorta:  No aneurysm identified.  Small bilateral pleural effusions noted.  IMPRESSION: No acute findings.   Original Report Authenticated By: Cyndie Chime, M.D.    Dg Chest Port 1 View 09/20/2012  *RADIOLOGY REPORT*  Clinical Data: Hypoxia  PORTABLE CHEST - 1 VIEW  Comparison: 09/18/2012  Findings: Interval development of extensive bilateral airspace disease in the lung bases.  This may be pneumonia or edema. Hypoventilation with decreased lung volume.  No significant pleural effusion.  IMPRESSION: Interval development of extensive bibasilar airspace disease which may represent  pneumonia or pulmonary edema.   Original Report Authenticated By: Camelia Phenes, M.D.    Dg Chest Port 1 View 09/18/2012  *RADIOLOGY REPORT*  Clinical Data: Shortness of breath and chest pain.  PORTABLE CHEST - 1 VIEW  Comparison: 09/16/2012  Findings: Shallow inspiration.  Increased density in the right lung base since previous study probably represents atelectasis. Pneumonia not excluded.  Heart size and pulmonary vascularity are normal for technique.  No blunting of costophrenic angles.  No pneumothorax.  Mediastinal contours appear intact.  IMPRESSION: Shallow inspiration with infiltration or atelectasis in the right lung base, new since previous study.   Original Report Authenticated By: Marlon Pel, M.D.    EKG: 20-Sep-2012 14:48:24 [facility] ROUTINE RECORD Sinus tachycardia Diffuse ST segment elevaation persists with PR depression and may be suggestive of acute pericarditis Abnormal ECG 56mm/s 50mm/mV 100Hz  8.0.1 12SL 241 HD CID: 1 Referred by: BRACKBILL Confirmed By: Hillis Range MD Vent. rate 115 BPM PR interval 144 ms QRS duration 84 ms QT/QTc 370/511 ms P-R-T axes 50 45 29  Echo: 09/20/2012 Study Conclusions - Left ventricle: The cavity size was normal. Wall thickness was increased in a pattern of mild LVH. Systolic function was vigorous. The estimated ejection fraction was in the range of 65% to 70%. Wall motion was normal; there were no regional wall motion abnormalities. - Pericardium, extracardiac: A moderate, free-flowing pericardial effusion was identified circumferential to the heart. The fluid had no internal echoes. There was a left pleural effusion.  09/24/2012 Study Conclusions - Left ventricle: Abnormal septal motion The cavity size was normal. Wall thickness was increased in a pattern of mild LVH. Systolic function was normal. The estimated ejection fraction was 55%. Wall motion was normal; there were no regional wall motion  abnormalities. - Atrial septum: No defect or patent foramen ovale was identified. - Pericardium, extracardiac: Small mostly posterior pericardial effusion   FOLLOW UP PLANS AND APPOINTMENTS No Known Allergies   Medication List     As of 09/25/2012  4:02 PM    TAKE these medications         colchicine 0.6 MG tablet   Take 1 tablet (0.6 mg total) by mouth 2 (two) times daily. Take 2 x day for 2 weeks, then 2 x day as needed.      ibuprofen 200 MG tablet   Commonly known as: ADVIL,MOTRIN   Take 3 tablets (600 mg total) by mouth every 6 (six) hours as needed for pain. Minimize use while on colchicine.      oxyCODONE-acetaminophen 5-325 MG per tablet   Commonly known as: PERCOCET/ROXICET   Take 1-2 tablets by mouth every 8 (eight) hours.          Discharge Orders    Future Appointments: Provider: Department: Dept Phone: Center:  10/10/2012 2:45 PM Duke Salvia, MD [facility] [phone] [facility]   10/27/2012 8:30 AM [facility] [phone] None   10/28/2012 2:30 PM Rachael Fee, MD [facility] [place] Office [phone] [facility]     Follow-up Information    Follow up with Rob Bunting, MD. On 10/28/2012. (2:30 PM for office visit re: abnormal liver tests)    Contact information:   [address] [place] [address] [phone]       Follow up with [facility]. On 10/27/2012. (go to lab anytime between 7:30 AM - 5:30 PM for lab work.  Needs to be done before office visit with Dr Christella Hartigan)    Contact information:   [address] [place] [address]       Follow up with Sherryl Manges, MD. On 10/10/2012. (at 2:45 pm)    Contact information:   [address] [place] [address] [phone]          BRING ALL MEDICATIONS WITH YOU TO FOLLOW UP APPOINTMENTS  Time spent with patient to include physician time: 35 min Signed: Theodore Demark 09/25/2012, 4:02 PM Co-Sign  MD

## 2012-09-25 NOTE — Progress Notes (Addendum)
[facility]    Subjective: Chest pain resolved  Antibiotics: NONE Anti-infectives    None      Medications: Scheduled Meds:    . colchicine  0.6 mg Oral BID  . oxyCODONE-acetaminophen  1-2 tablet Oral Q8H  . sodium chloride  3 mL Intravenous Q12H   Continuous Infusions:    . sodium chloride 20 mL/hr at 09/21/12 0600   PRN Meds:.sodium chloride, acetaminophen, ALPRAZolam, fentaNYL, magnesium hydroxide, ondansetron (ZOFRAN) IV, sodium chloride, sodium chloride, zolpidem   Objective: Weight change:   Intake/Output Summary (Last 24 hours) at 09/25/12 1514 Last data filed at 09/25/12 0828  Gross per 24 hour  Intake    240 ml  Output   1650 ml  Net  -1410 ml   Blood pressure 135/68, pulse 112, temperature 101.6 F (38.7 C), temperature source Oral, resp. rate 20, height 5\' 10"  (1.778 m), weight 237 lb 14 oz (107.9 kg), SpO2 96.00%. Temp:  [100.4 F (38 C)-101.6 F (38.7 C)] 101.6 F (38.7 C) (10/24 0509) Pulse Rate:  [96-112] 112  (10/24 0509) Resp:  [19-20] 20  (10/24 0509) BP: (129-135)/(68-75) 135/68 mmHg (10/24 0509) SpO2:  [96 %-97 %] 96 % (10/24 0509)  Physical Exam: General: Alert and awake, oriented x3, on RA CVS RRR,  no murmur rubs or gallops Chest: no crackles noted on my exam Abdomen: soft nontender, nondistended, normal bowel sounds,  Lab Results: No results found for this basename: WBC:2,HGB:2,HCT:2,PLT:2 in the last 72 hours  BMET  Basename 09/25/12 0525 09/24/12 0535  NA 136 148*  K 4.0 5.5*  CL 96 103  CO2 28 30  GLUCOSE 122* 115*  BUN 16 20  CREATININE 1.65* 1.74*  CALCIUM 9.7 9.9    Micro Results: Recent Results (from the past 240 hour(s))  CULTURE, BLOOD (ROUTINE X 2)     Status: Normal   Collection Time   09/18/12  9:20 PM      Component Value Range Status Comment   Specimen Description BLOOD RIGHT ARM   Final    Special Requests BOTTLES DRAWN AEROBIC AND ANAEROBIC 10CC   Final    Culture   Setup Time 09/19/2012 02:06   Final    Culture NO GROWTH 5 DAYS   Final    Report Status 09/25/2012 FINAL   Final   CULTURE, BLOOD (ROUTINE X 2)     Status: Normal   Collection Time   09/18/12  9:25 PM      Component Value Range Status Comment   Specimen Description BLOOD RIGHT HAND   Final    Special Requests BOTTLES DRAWN AEROBIC AND ANAEROBIC 10CC   Final    Culture  Setup Time 09/19/2012 02:06   Final    Culture NO GROWTH 5 DAYS   Final    Report Status 09/25/2012 FINAL   Final   CULTURE, BLOOD (ROUTINE X 2)     Status: Normal   Collection Time   09/19/12  4:26 PM      Component Value Range Status Comment   Specimen Description BLOOD RIGHT HAND   Final    Special Requests BOTTLES DRAWN AEROBIC AND ANAEROBIC 10CC   Final    Culture  Setup Time 09/20/2012 01:38   Final    Culture     Final    Value: DIPHTHEROIDS(CORYNEBACTERIUM SPECIES)     Note: Standardized susceptibility testing for this organism is not available.     Note: Gram Stain Report Called to,Read  Back By and Verified With: DANA SULLIVAN 12:20PM 09/21/12 BY GUSTK   Report Status 09/22/2012 FINAL   Final   AFB CULTURE, BLOOD     Status: Normal (Preliminary result)   Collection Time   09/19/12  4:26 PM      Component Value Range Status Comment   Specimen Description BLOOD RIGHT HAND   Final    Special Requests ACID FAST BACILLI 5CC   Final    Culture     Final    Value: CULTURE WILL BE EXAMINED FOR 6 WEEKS BEFORE ISSUING A FINAL REPORT   Report Status PENDING   Incomplete   CULTURE, BLOOD (ROUTINE X 2)     Status: Normal (Preliminary result)   Collection Time   09/19/12  4:39 PM      Component Value Range Status Comment   Specimen Description BLOOD RIGHT ARM   Final    Special Requests BOTTLES DRAWN AEROBIC AND ANAEROBIC 10CC   Final    Culture  Setup Time 09/20/2012 02:06   Final    Culture     Final    Value:        BLOOD CULTURE RECEIVED NO GROWTH TO DATE CULTURE WILL BE HELD FOR 5 DAYS BEFORE ISSUING A FINAL  NEGATIVE REPORT   Report Status PENDING   Incomplete   FUNGUS CULTURE, BLOOD     Status: Normal (Preliminary result)   Collection Time   09/19/12  4:39 PM      Component Value Range Status Comment   Specimen Description BLOOD RIGHT ARM   Final    Special Requests BOTTLES DRAWN AEROBIC AND ANAEROBIC 10CC   Final    Culture NO FUNGUS ISOLATED;CULTURE IN PROGRESS FOR 7 DAYS   Final    Report Status PENDING   Incomplete   MRSA PCR SCREENING     Status: Normal   Collection Time   09/20/12  8:36 PM      Component Value Range Status Comment   MRSA by PCR NEGATIVE  NEGATIVE Final     Studies/Results: No results found.    Assessment/Plan: Michael [name] is a 31 y.o. male with chronic transaminitis, pericarditis with effusion.    1) Pericarditis: work up negative.  No obvious etiology.     2) Fever - continues to have fever.  No obvious bacterial source, likely related to # 1.  Other possibility could be pleural effusion but he is completely asymptomatic from that standpoint.    The pericardial effusion is resolving and clinically he feels much better.  Therefore, there is not evidence to think that the fever is from any other source at this time.     -continue with the current pericarditis treatment Thanks for the consult, call with questions.         LOS: 7 days   COMER, ROBERT 09/25/2012, 3:14 PM

## 2012-09-26 LAB — CULTURE, BLOOD (ROUTINE X 2): Culture: NO GROWTH

## 2012-09-28 ENCOUNTER — Inpatient Hospital Stay (HOSPITAL_COMMUNITY)
Admission: EM | Admit: 2012-09-28 | Discharge: 2012-10-01 | DRG: 543 | Disposition: A | Discharge status: Home / Self Care | Payer: BC Managed Care – PPO | Attending: Internal Medicine | Admitting: Internal Medicine

## 2012-09-28 ENCOUNTER — Ambulatory Visit (HOSPITAL_COMMUNITY): Admission: EM | Admit: 2012-09-28 | Payer: BC Managed Care – PPO | Admitting: Cardiovascular Disease

## 2012-09-28 ENCOUNTER — Encounter (HOSPITAL_COMMUNITY): Payer: Self-pay | Admitting: Emergency Medicine

## 2012-09-28 ENCOUNTER — Emergency Department (INDEPENDENT_AMBULATORY_CARE_PROVIDER_SITE_OTHER)
Admission: EM | Admit: 2012-09-28 | Discharge: 2012-09-28 | Disposition: A | Discharge status: Other Acute Inpatient Hospital | Payer: BC Managed Care – PPO | Source: Home / Self Care | Attending: Family Medicine | Admitting: Family Medicine

## 2012-09-28 ENCOUNTER — Encounter (HOSPITAL_COMMUNITY)
Admission: EM | Disposition: A | Discharge status: Home / Self Care | Payer: Self-pay | Source: Home / Self Care | Attending: Internal Medicine

## 2012-09-28 ENCOUNTER — Encounter (HOSPITAL_COMMUNITY): Payer: Self-pay

## 2012-09-28 DIAGNOSIS — D72825 Bandemia: Secondary | ICD-10-CM

## 2012-09-28 DIAGNOSIS — Z6835 Body mass index (BMI) 35.0-35.9, adult: Secondary | ICD-10-CM

## 2012-09-28 DIAGNOSIS — R079 Chest pain, unspecified: Secondary | ICD-10-CM

## 2012-09-28 DIAGNOSIS — I10 Essential (primary) hypertension: Secondary | ICD-10-CM | POA: Diagnosis present

## 2012-09-28 DIAGNOSIS — R109 Unspecified abdominal pain: Secondary | ICD-10-CM

## 2012-09-28 DIAGNOSIS — I309 Acute pericarditis, unspecified: Secondary | ICD-10-CM

## 2012-09-28 DIAGNOSIS — I213 ST elevation (STEMI) myocardial infarction of unspecified site: Secondary | ICD-10-CM

## 2012-09-28 DIAGNOSIS — N179 Acute kidney failure, unspecified: Secondary | ICD-10-CM | POA: Diagnosis present

## 2012-09-28 DIAGNOSIS — D72829 Elevated white blood cell count, unspecified: Secondary | ICD-10-CM

## 2012-09-28 DIAGNOSIS — I319 Disease of pericardium, unspecified: Principal | ICD-10-CM | POA: Diagnosis present

## 2012-09-28 DIAGNOSIS — R197 Diarrhea, unspecified: Secondary | ICD-10-CM | POA: Diagnosis present

## 2012-09-28 DIAGNOSIS — E669 Obesity, unspecified: Secondary | ICD-10-CM | POA: Diagnosis present

## 2012-09-28 HISTORY — PX: LEFT HEART CATHETERIZATION WITH CORONARY ANGIOGRAM: SHX5451

## 2012-09-28 LAB — DIFFERENTIAL
Eosinophils Relative: 0 % (ref 0–5)
Lymphs Abs: 1.3 10*3/uL (ref 0.7–4.0)
Monocytes Absolute: 1.1 10*3/uL — ABNORMAL HIGH (ref 0.1–1.0)
Monocytes Relative: 4 % (ref 3–12)
Neutro Abs: 24.1 10*3/uL — ABNORMAL HIGH (ref 1.7–7.7)
WBC Morphology: INCREASED

## 2012-09-28 LAB — BASIC METABOLIC PANEL
CO2: 25 mEq/L (ref 19–32)
Chloride: 93 mEq/L — ABNORMAL LOW (ref 96–112)
Creatinine, Ser: 1.57 mg/dL — ABNORMAL HIGH (ref 0.50–1.35)
GFR calc Af Amer: 66 mL/min — ABNORMAL LOW (ref >90)
Potassium: 4.4 mEq/L (ref 3.5–5.1)
Sodium: 132 mEq/L — ABNORMAL LOW (ref 135–145)

## 2012-09-28 LAB — FUNGUS CULTURE, BLOOD: Culture: NO GROWTH

## 2012-09-28 LAB — CBC
HCT: 37.2 % — ABNORMAL LOW (ref 39.0–52.0)
MCH: 29.4 pg (ref 26.0–34.0)
MCV: 86.1 fL (ref 78.0–100.0)
Platelets: 587 10*3/uL — ABNORMAL HIGH (ref 150–400)
RBC: 4.32 MIL/uL (ref 4.22–5.81)
WBC: 26.5 10*3/uL — ABNORMAL HIGH (ref 4.0–10.5)

## 2012-09-28 LAB — PROTIME-INR: INR: 1.41 (ref 0.00–1.49)

## 2012-09-28 LAB — APTT: aPTT: 34 seconds (ref 24–37)

## 2012-09-28 SURGERY — LEFT HEART CATHETERIZATION WITH CORONARY ANGIOGRAM
Anesthesia: Moderate Sedation

## 2012-09-28 MED ORDER — ACETAMINOPHEN 325 MG PO TABS
650.0000 mg | ORAL_TABLET | ORAL | Status: DC | PRN
  Administered 2012-09-28 – 2012-09-29 (×2): 650 mg via ORAL
  Filled 2012-09-28 (×2): qty 2

## 2012-09-28 MED ORDER — SODIUM CHLORIDE 0.9 % IV SOLN
INTRAVENOUS | Status: DC
  Administered 2012-09-28 – 2012-09-29 (×2): 125 mL/h via INTRAVENOUS

## 2012-09-28 MED ORDER — ASPIRIN 81 MG PO CHEW
324.0000 mg | CHEWABLE_TABLET | Freq: Once | ORAL | Status: AC
  Administered 2012-09-28: 324 mg via ORAL
  Filled 2012-09-28: qty 4

## 2012-09-28 MED ORDER — MELOXICAM 15 MG PO TABS
15.0000 mg | ORAL_TABLET | Freq: Every day | ORAL | Status: DC
  Administered 2012-09-28 – 2012-09-29 (×2): 15 mg via ORAL
  Filled 2012-09-28 (×2): qty 1

## 2012-09-28 MED ORDER — LIDOCAINE HCL (PF) 1 % IJ SOLN
INTRAMUSCULAR | Status: AC
  Filled 2012-09-28: qty 30

## 2012-09-28 MED ORDER — SODIUM CHLORIDE 0.9 % IV SOLN
INTRAVENOUS | Status: DC
  Administered 2012-09-28: 11:00:00 via INTRAVENOUS

## 2012-09-28 MED ORDER — ONDANSETRON HCL 4 MG/2ML IJ SOLN: 4.0000 mg | Freq: Four times a day (QID) | INTRAMUSCULAR | Status: DC | PRN

## 2012-09-28 MED ORDER — SODIUM CHLORIDE 0.9 % IV SOLN: Freq: Once | INTRAVENOUS | Status: DC

## 2012-09-28 MED ORDER — NITROGLYCERIN 0.2 MG/ML ON CALL CATH LAB
INTRAVENOUS | Status: AC
  Filled 2012-09-28: qty 1

## 2012-09-28 MED ORDER — MIDAZOLAM HCL 2 MG/2ML IJ SOLN
INTRAMUSCULAR | Status: AC
  Filled 2012-09-28: qty 2

## 2012-09-28 MED ORDER — FENTANYL CITRATE 0.05 MG/ML IJ SOLN
INTRAMUSCULAR | Status: AC
  Filled 2012-09-28: qty 2

## 2012-09-28 MED ORDER — HEPARIN BOLUS VIA INFUSION
4000.0000 [IU] | Freq: Once | INTRAVENOUS | Status: DC
  Filled 2012-09-28: qty 4000

## 2012-09-28 NOTE — H&P (Signed)
Michael [name] is an 31 y.o. male.   Chief Complaint: epigastric pain HPI: The patient is a 31 yo man who was diagnosed with acute pericarditis last week. He was treated with colchicine and discharged home. He presents today with mid-epigastric pain and nausea and his ECG demonstrates ST elevation and PR depression. An acute MI was called by the ED MD and cath lab activated. It was decided for him to undergo left heart cath and this demonstrated no obstructive CAD. He is admitted for observation, the concern being that his epigastric pain is related to gastritis or ulcer disease on colchicine. He denies fever or chills. No diarrhea.  Past Medical History  Diagnosis Date  . Elevated transaminase level/Chronic 09/17/2012    thes date back to age 72.   . Elevated blood pressure 09/17/2012  . Left ventricular hypertrophy 09/17/2012  . Acute pericarditis, unspecified 09/17/2012  . Obesity     on 09/18/12  BMI 35.6 weight 112.2 kg/247#    History reviewed. No pertinent past surgical history.  No family history on file. Social History:  reports that he has quit smoking. He does not have any smokeless tobacco history on file. He reports that he drinks alcohol. His drug history not on file.  Allergies: No Known Allergies  Medications Prior to Admission  Medication Sig Dispense Refill  . colchicine 0.6 MG tablet Take 0.6 mg by mouth 2 (two) times daily.      Marland Kitchen oxyCODONE-acetaminophen (PERCOCET/ROXICET) 5-325 MG per tablet Take 1 tablet by mouth every 8 (eight) hours as needed. For pain        Results for orders placed during the hospital encounter of 09/28/12 (from the past 48 hour(s))  CBC     Status: Abnormal   Collection Time   09/28/12 11:10 AM      Component Value Range Comment   WBC 26.5 (*) 4.0 - 10.5 K/uL REPEATED TO VERIFY   RBC 4.32  4.22 - 5.81 MIL/uL    Hemoglobin 12.7 (*) 13.0 - 17.0 g/dL    HCT 16.1 (*) 09.6 - 52.0 %    MCV 86.1  78.0 - 100.0 fL    MCH 29.4  26.0 - 34.0 pg     MCHC 34.1  30.0 - 36.0 g/dL    RDW 04.5  40.9 - 81.1 %    Platelets 587 (*) 150 - 400 K/uL   DIFFERENTIAL     Status: Abnormal   Collection Time   09/28/12 11:10 AM      Component Value Range Comment   Neutrophils Relative 91 (*) 43 - 77 %    Lymphocytes Relative 5 (*) 12 - 46 %    Monocytes Relative 4  3 - 12 %    Eosinophils Relative 0  0 - 5 %    Basophils Relative 0  0 - 1 %    Neutro Abs 24.1 (*) 1.7 - 7.7 K/uL    Lymphs Abs 1.3  0.7 - 4.0 K/uL    Monocytes Absolute 1.1 (*) 0.1 - 1.0 K/uL    Eosinophils Absolute 0.0  0.0 - 0.7 K/uL    Basophils Absolute 0.0  0.0 - 0.1 K/uL    WBC Morphology INCREASED BANDS (>20% BANDS)   TOXIC GRANULATION   Smear Review LARGE PLATELETS PRESENT     PROTIME-INR     Status: Abnormal   Collection Time   09/28/12 11:10 AM      Component Value Range Comment   Prothrombin Time 16.9 (*)  11.6 - 15.2 seconds    INR 1.41  0.00 - 1.49   APTT     Status: Normal   Collection Time   09/28/12 11:10 AM      Component Value Range Comment   aPTT 34  24 - 37 seconds   BASIC METABOLIC PANEL     Status: Abnormal   Collection Time   09/28/12 11:10 AM      Component Value Range Comment   Sodium 132 (*) 135 - 145 mEq/L    Potassium 4.4  3.5 - 5.1 mEq/L    Chloride 93 (*) 96 - 112 mEq/L    CO2 25  19 - 32 mEq/L    Glucose, Bld 156 (*) 70 - 99 mg/dL    BUN 24 (*) 6 - 23 mg/dL    Creatinine, Ser 1.91 (*) 0.50 - 1.35 mg/dL    Calcium 47.8  8.4 - 10.5 mg/dL    GFR calc non Af Amer 57 (*) >90 mL/min    GFR calc Af Amer 66 (*) >90 mL/min    No results found.  ROS - all systems reviewed and negative except as noted in the HPI.  Physical exam  Blood pressure 136/88, temperature 99 F (37.2 C), temperature source Oral, resp. rate 30, SpO2 95.00%. Well appearing NAD HEENT: Unremarkable Neck:  No JVD, no thyromegally Lymphatics:  No adenopathy Back:  No CVA tenderness Lungs:  Clear HEART:  Regular rate rhythm, no murmurs, no rubs, no clicks Abd:   soft, obese with mild tenderness, positive bowel sounds, no organomegally, no rebound, no guarding Ext:  2 plus pulses, no edema, no cyanosis, no clubbing Skin:  No rashes no nodules Neuro:  CN II through XII intact, motor grossly intact  EKG: NSR with marked ST elevation and mild PR depression and marked STT abnormality in the anteroseptal leads.  Assessment/Plan 1. Mid-epigastric pain  2. Very abnormal ECG worrisome for acute MI  3. Left heart cath demonstrating no CAD 4. Sleep apnea Rec: will admit for hydrogen pump blockers and will stop colchicine as it appears to be causing his epigastric pain, and start a cox inhibitor.  Gregg Taylor,M.D. 09/28/2012, 12:57 PM

## 2012-09-28 NOTE — Progress Notes (Signed)
Chaplain responded promptly to Code STEMI in ER Pod B room 14.  Patient was alert and responsive...said his wife was on the way.  Met patient's family in ER waiting room and took them to [facility] Consult Room 4.  Acted as [employer] forces training and education officer and family and stayed with them as dr briefed them and Then I went with them as patient was moved to room 2002.  Very pleasant family...patient is an only child.  Will follow up as needed.  Chaplain Rutherford Nail

## 2012-09-28 NOTE — ED Notes (Addendum)
Released from [facility] 2 days ago w dx pericarditis. Was reportedly doing well until yesterday  PM, when began to have epigastrica area pain, nausea. No c/o dizziness. Presently w/d/color good, uncomfortable laying down denies ETOH or drug use

## 2012-09-28 NOTE — ED Notes (Signed)
[facility] called for transport - report given to [facility] charge nurse in [facility] ed

## 2012-09-28 NOTE — ED Notes (Signed)
Received pt from urgent care with c/o abdominal cramping onset 0300 with n/v/d and diaphoresis. ECG done at urgent care and read as STEMI. Pt transported here by [facility].

## 2012-09-28 NOTE — ED Provider Notes (Signed)
History     CSN: [id number]  Arrival date & time 09/28/12  4098   First MD Initiated Contact with Patient 09/28/12 1014      Chief Complaint  Patient presents with  . Chest Pain    (Consider location/radiation/quality/duration/timing/severity/associated sxs/prior treatment) HPI Comments: 31 year old male with recent hospital admission and discharged on October 24 with the diagnoses of viral pericarditis. Here complaining of epigastric pain, nausea, vomiting, diarrhea and dizziness symptoms also associated with general malaise and chills since last night.  Last bowel movement this morning diarrhea with no blood or melena. Last emesis last evening. No oral intake since last evening. Last emesis last night with no blood. Denies chest pain or shortness of breath. Denies recent alcohol or other illicit drug intake. Patient reports he felt better but no completely well after recent discharge.  Patient is a 31 y.o. male presenting with chest pain.  Chest Pain Primary symptoms include fatigue, abdominal pain, nausea, vomiting and dizziness. Pertinent negatives for primary symptoms include no fever.  Dizziness also occurs with nausea and vomiting.     Past Medical History  Diagnosis Date  . Elevated transaminase level/Chronic 09/17/2012    thes date back to age 43.   . Elevated blood pressure 09/17/2012  . Left ventricular hypertrophy 09/17/2012  . Acute pericarditis, unspecified 09/17/2012  . Obesity     on 09/18/12  BMI 35.6 weight 112.2 kg/247#    History reviewed. No pertinent past surgical history.  No family history on file.  History  Substance Use Topics  . Smoking status: Former Games developer  . Smokeless tobacco: Not on file  . Alcohol Use: Yes      Review of Systems  Constitutional: Positive for chills, activity change, appetite change and fatigue. Negative for fever.  HENT: Negative for sore throat.   Cardiovascular: Negative for chest pain and leg swelling.    Gastrointestinal: Positive for nausea, vomiting, abdominal pain, diarrhea and abdominal distention. Negative for constipation and blood in stool.  Genitourinary: Negative for dysuria and hematuria.  Skin: Negative for rash.  Neurological: Positive for dizziness.  Psychiatric/Behavioral: Negative for confusion.  All other systems reviewed and are negative.    Allergies  Review of patient's allergies indicates no known allergies.  Home Medications   No current outpatient prescriptions on file.  BP 102/72  Pulse 105  Temp 98.8 F (37.1 C) (Oral)  Resp 22  SpO2 97%  Physical Exam  Nursing note and vitals reviewed. Constitutional: He is oriented to person, place, and time. He appears well-developed and well-nourished. He appears distressed.       Uncomfortable laying in bed  Cardiovascular: Normal rate, regular rhythm and intact distal pulses.  Exam reveals friction rub. Exam reveals no gallop.   No murmur heard.      Heart tones distal but regular and rhythmic impress discrete rub.  Pulmonary/Chest: Effort normal and breath sounds normal. He has no wheezes. He has no rales. He exhibits no tenderness.  Abdominal: He exhibits no mass.       Impress mildly distended. Soft. Epigastric tenderness with light palpation. Impress rebound. No guarding.  Neurological: He is alert and oriented to person, place, and time.  Skin: No rash noted. He is diaphoretic.    ED Course  Procedures (including critical care time)  Labs Reviewed - No data to display No results found.   1. Abdominal pain       MDM  31 year old male with recent hospital admission and discharged on October 24  with the diagnoses of viral pericarditis. Here complaining of epigastric pain, nausea, vomiting associated with general malaise and chills since last night. On exam: Discrete pericardial rub otherwise cardiac sounds although distal appears rhythmic with normal rate. No JVD. No respiratory distress. Lungs  clear. Abdomen appears distended with tenderness to palpation on epigastric area mostly but also diffusely. Impress rebound tenderness. Afebrile. Vital signs stable. Informational point-of-care bedside ultrasound with evidence of pericardial effusion. EKG with ST changes and prolonged QT. Decided to transfer to the emergency department via [facility] for further evaluation and management. No blood testing or imaging has been started at the urgent care prior to transfer.        Sharin Grave, MD 09/29/12 [phone]

## 2012-09-28 NOTE — ED Notes (Signed)
BS cardiac monitor, NSR, O2 At 2/liters per min, [facility] for transport

## 2012-09-28 NOTE — CV Procedure (Signed)
Michael [name] is a 31 y.o. male    [id number]  [id number] LOCATION:  FACILITY: [facility]  PHYSICIAN: Lennette Bihari, M.D. [birth year]   DATE OF PROCEDURE:  09/28/2012  DATE OF DISCHARGE:  [facility]  CARDIAC CATHETERIZATION     History obtained from the patient and recent hospitalization records.   PROCEDURE DESCRIPTION:   The patient was brought to the second floor   Cardiac cath lab emergently due to ST elevation and T wave changes in this patient recently hospitalized with pericarditis. He was  premedicated with 2 mg versed/50 micrograms of fentanyl.Marland Kitchen His right  groin was prepped and shaved in usual sterile fashion. Xylocaine 1% was used  for local anesthesia. A 5 French sheath was inserted into the right femoral  artery without difficulty. He did not receive heparin in ER due to concern of pericarditis.   HEMODYNAMICS:    AO SYSTOLIC/AO DIASTOLIC: 110/82   LV SYSTOLIC/LV DIASTOLIC: 110/12  ANGIOGRAPHIC RESULTS:   1. Left main:  nl  2. LAD: nl 3. Ramus: nl 4. Left circumflex: nl  5. Right coronary artery: nl   6.  Left ventriculography; RAO left ventriculogram was performed using  25 mL of Omnipaque dye at 12 mL/second. The overall LVEF estimated  55 %. There was normal wall motion.     IMPRESSION:  Normal LV function                           Normal coronary arteries  ECG changes due to pericardities  Tolerated well.  Patient will be admitted under the [facility] service.  Lennette Bihari, MD, [facility] 09/28/2012 12:13 PM

## 2012-09-28 NOTE — ED Provider Notes (Addendum)
History     CSN: [id number]  Arrival date & time 09/28/12  1053   None     No chief complaint on file.  chief complaint chest pain  (Consider location/radiation/quality/duration/timing/severity/associated sxs/prior treatment) HPI Seen on arrival patient developed anterior nonradiating chest pain dull in quality onset 2 AM today does not feel like pericarditis he is suffered from recently. Since his color by nausea and generalized weakness denies shortness of breath denies other complaint [facility] transferred here for further evaluation Past Medical History  Diagnosis Date  . Elevated transaminase level/Chronic 09/17/2012    thes date back to age 46.   . Elevated blood pressure 09/17/2012  . Left ventricular hypertrophy 09/17/2012  . Acute pericarditis, unspecified 09/17/2012  . Obesity     on 09/18/12  BMI 35.6 weight 112.2 kg/247#   This report pain is presently 6 on a scale 1-10 No past surgical history on file.  No family history on file.  History  Substance Use Topics  . Smoking status: Former Games developer  . Smokeless tobacco: Not on file  . Alcohol Use: Yes      Review of Systems  Cardiovascular: Positive for chest pain.  Gastrointestinal: Positive for nausea.  All other systems reviewed and are negative.    Allergies  Review of patient's allergies indicates no known allergies.  Home Medications   Current Outpatient Rx  Name Route Sig Dispense Refill  . COLCHICINE 0.6 MG PO TABS Oral Take 0.6 mg by mouth 2 (two) times daily.    . OXYCODONE-ACETAMINOPHEN 5-325 MG PO TABS Oral Take 1 tablet by mouth every 8 (eight) hours as needed. For pain      There were no vitals taken for this visit.  Physical Exam  Nursing note and vitals reviewed. Constitutional: He appears well-developed and well-nourished. He appears distressed.       Appears mildly uncomfortable  HENT:  Head: Normocephalic and atraumatic.  Eyes: Conjunctivae normal  are normal. Pupils are equal, round, and reactive to light.  Neck: Neck supple. No tracheal deviation present. No thyromegaly present.  Cardiovascular: Normal rate and regular rhythm.   No murmur heard. Pulmonary/Chest: Effort normal and breath sounds normal.  Abdominal: Soft. Bowel sounds are normal. He exhibits no distension. There is no tenderness.  Musculoskeletal: Normal range of motion. He exhibits no edema and no tenderness.  Neurological: He is alert. Coordination normal.  Skin: Skin is warm and dry. No rash noted.  Psychiatric: He has a normal mood and affect.    ED Course  Procedures (including critical care time)  Labs Reviewed - No data to display No results found.   No diagnosis found.   Date: 09/28/2012  Rate: 95  Rhythm: normal sinus rhythm  QRS Axis: normal  Intervals: normal  ST/T Wave abnormalities: ST elevations inferiorly  Conduction Disutrbances:none  Narrative Interpretation: Reciprocal anterior T wave changes, consistent with acute inferior wall ST segment myocardial infarction  Old EKG Reviewed: Changes consistent with STEMI new over 09/20/2012 Results for orders placed during the hospital encounter of 09/28/12  CBC      Component Value Range   WBC 26.5 (*) 4.0 - 10.5 K/uL   RBC 4.32  4.22 - 5.81 MIL/uL   Hemoglobin 12.7 (*) 13.0 - 17.0 g/dL   HCT 78.2 (*) 95.6 - 21.3 %   MCV 86.1  78.0 - 100.0 fL   MCH 29.4  26.0 - 34.0 pg   MCHC 34.1  30.0 - 36.0  g/dL   RDW 24.4  01.0 - 27.2 %   Platelets 587 (*) 150 - 400 K/uL  DIFFERENTIAL      Component Value Range   Neutrophils Relative PENDING  43 - 77 %   Neutro Abs PENDING  1.7 - 7.7 K/uL   Band Neutrophils PENDING  0 - 10 %   Lymphocytes Relative PENDING  12 - 46 %   Lymphs Abs PENDING  0.7 - 4.0 K/uL   Monocytes Relative PENDING  3 - 12 %   Monocytes Absolute PENDING  0.1 - 1.0 K/uL   Eosinophils Relative PENDING  0 - 5 %   Eosinophils Absolute PENDING  0.0 - 0.7 K/uL   Basophils Relative PENDING   0 - 1 %   Basophils Absolute PENDING  0.0 - 0.1 K/uL   WBC Morphology PENDING     RBC Morphology PENDING     Smear Review PENDING     nRBC PENDING  0 /100 WBC   Metamyelocytes Relative PENDING     Myelocytes PENDING     Promyelocytes Absolute PENDING     Blasts PENDING    PROTIME-INR      Component Value Range   Prothrombin Time 16.9 (*) 11.6 - 15.2 seconds   INR 1.41  0.00 - 1.49  APTT      Component Value Range   aPTT 34  24 - 37 seconds   Dg Chest 2 View  09/21/2012  *RADIOLOGY REPORT*  Clinical Data: Evaluate for edema versus infection  CHEST - 2 VIEW  Comparison: 09/20/2012  Findings: Heart size appears normal. There are bilateral pleural effusions left greater than right. Persistent bilateral airspace opacities are again noted, right greater than left.  Not significantly improved from previous exam.  IMPRESSION:  1.  No significant change in bilateral airspace opacities and pleural effusions.   Original Report Authenticated By: Rosealee Albee, M.D.    Dg Chest 2 View  09/20/2012  *RADIOLOGY REPORT*  Clinical Data: Shortness of breath.  CHEST - 2 VIEW  Comparison: Same day.  Findings: Mild bilateral pleural effusions are noted. Cardiomediastinal silhouette appears normal.  Right upper and lower lobe opacities are again noted and unchanged compared to prior exam.  Left basilar opacity is also noted which is unchanged.  IMPRESSION: Bilateral lung opacities and pleural effusions are again noted and unchanged; these findings consistent with bilateral pneumonia or possibly edema.   Original Report Authenticated By: Venita Sheffield., M.D.    Dg Chest 2 View  09/18/2012  *RADIOLOGY REPORT*  Clinical Data: Mid chest pain.  Shortness of breath.  Fever.  CHEST - 2 VIEW  Comparison: 09/18/2012 at 0557 hours the  Findings: Shallow inspiration.  Cardiac enlargement with borderline pulmonary vascularity.  Infiltration or atelectasis in the lung bases.  No blunting of costophrenic angles.  No  pneumothorax.  IMPRESSION: Cardiac enlargement.  Shallow inspiration with infiltration or atelectasis in the lung bases.   Original Report Authenticated By: Marlon Pel, M.D.    Dg Chest 2 View  09/16/2012  *RADIOLOGY REPORT*  Clinical Data: Mid to chest pain and shortness of breath.  CHEST - 2 VIEW  Comparison: 11/09/2010  Findings: Shallow inspiration. The heart size and pulmonary vascularity are normal. The lungs appear clear and expanded without focal air space disease or consolidation. No blunting of the costophrenic angles.  No pneumothorax.  Mediastinal contours appear intact.  No significant change since previous study.  IMPRESSION: No evidence of active pulmonary disease.  Original Report Authenticated By: Marlon Pel, M.D.    Ct Angio Chest Pe W/cm &/or Wo Cm  09/17/2012  *RADIOLOGY REPORT*  Clinical Data: Low grade fever.  Pleurisy.  Rule out pulmonary embolism.  Tachycardia.  CT ANGIOGRAPHY CHEST  Technique:  Multidetector CT imaging of the chest using the standard protocol during bolus administration of intravenous contrast. Multiplanar reconstructed images including MIPs were obtained and reviewed to evaluate the vascular anatomy.  Contrast: OMNIPAQUE IOHEXOL 350 MG/ML SOLN  Comparison: Plain film of 1 day prior.  No prior CT.  Findings: Lung windows demonstrate minimal motion degradation. Nonspecific bronchial wall thickening to the right lower lobe. Minimal ground-glass opacity at the central right lower lobe is favored to be due to subsegmental atelectasis.  Soft tissue windows:  The quality of this exam for evaluation of pulmonary embolism is moderate.  In addition to mild motion artifact, the bolus is suboptimally timed, with a large amount contrast remaining in the SVC.  No evidence of pulmonary embolism to the large segmental level.  Bovine arch. Normal aortic caliber without dissection.  Mild cardiomegaly.  Trace right greater than left pleural fluid, possibly  physiologic.  Upper normal subcarinal nodal tissue 1.1 cm. Mild azygo-esophageal recess adenopathy at 1.1 cm on image 39.  No hilar adenopathy. The lower thoracic esophagus is mildly dilated with a fluid level within.  Residual thymic tissue in the anterior mediastinum.  Limited abdominal imaging demonstrates no significant findings.  No acute osseous abnormality.  IMPRESSION:  1.  Moderate quality exam.  No evidence of pulmonary embolism to the large segmental level. 2.  Mild cardiomegaly with trace bilateral pleural fluid.  This could be physiologic or relate to mild fluid overload. 3.  Prominent mediastinal nodes, favored to be reactive.  Consider follow-up chest CT at 3 - 6 months. 4.  Mildly dilated lower thoracic esophagus.  Question dysmotility or gastroesophageal reflux.   Original Report Authenticated By: Consuello Bossier, M.D.    US Abdomen Complete  09/18/2012  *RADIOLOGY REPORT*  Clinical Data:  Elevated LFTs.  COMPLETE ABDOMINAL ULTRASOUND  Comparison:  Chest CT 09/17/2012  Findings:  Gallbladder:  No stones or wall thickening.  Negative sonographic Murphy's.  Common bile duct:   Normal caliber, 3 mm.  Liver:  No focal lesion identified.  Within normal limits in parenchymal echogenicity.  IVC:  Appears normal.  Pancreas:  No focal abnormality seen.  Spleen:  Within normal limits in size and echotexture.  Right Kidney:   Normal in size and parenchymal echogenicity.  No evidence of mass or hydronephrosis.  Left Kidney:  Normal in size and parenchymal echogenicity.  No evidence of mass or hydronephrosis.  Abdominal aorta:  No aneurysm identified.  Small bilateral pleural effusions noted.  IMPRESSION: No acute findings.   Original Report Authenticated By: Cyndie Chime, M.D.    Dg Chest Port 1 View  09/20/2012  *RADIOLOGY REPORT*  Clinical Data: Hypoxia  PORTABLE CHEST - 1 VIEW  Comparison: 09/18/2012  Findings: Interval development of extensive bilateral airspace disease in the lung bases.  This may  be pneumonia or edema. Hypoventilation with decreased lung volume.  No significant pleural effusion.  IMPRESSION: Interval development of extensive bibasilar airspace disease which may represent pneumonia or pulmonary edema.   Original Report Authenticated By: Camelia Phenes, M.D.    Dg Chest Port 1 View  09/18/2012  *RADIOLOGY REPORT*  Clinical Data: Shortness of breath and chest pain.  PORTABLE CHEST - 1 VIEW  Comparison:  09/16/2012  Findings: Shallow inspiration.  Increased density in the right lung base since previous study probably represents atelectasis. Pneumonia not excluded.  Heart size and pulmonary vascularity are normal for technique.  No blunting of costophrenic angles.  No pneumothorax.  Mediastinal contours appear intact.  IMPRESSION: Shallow inspiration with infiltration or atelectasis in the right lung base, new since previous study.   Original Report Authenticated By: Marlon Pel, M.D.     Spoke with patient's wife with patient's permission to advise her of patient's going to cardiac catheterization laboratory MDM  Code STEMI called upon arrival. Dr. Tresa Endo arrived to take patient to the cardiac catheterization laboratory. Heparin and aspirin ordered by me. Heparin was subsequently withheld by Dr. Tresa Endo who evaluated patient in light of fact of recent pericarditis and pericardial effusion so as not to convert to hemorrhagic pericarditis   Dx : STEMI    CRITICAL CARE Performed by: Doug Sou   Total critical care time: 30 minute  Critical care time was exclusive of separately billable procedures and treating other patients.  Critical care was necessary to treat or prevent imminent or life-threatening deterioration.  Critical care was time spent personally by me on the following activities: development of treatment plan with patient and/or surrogate as well as nursing, discussions with consultants, evaluation of patient's response to treatment, examination of  patient, obtaining history from patient or surrogate, ordering and performing treatments and interventions, ordering and review of laboratory studies, ordering and review of radiographic studies, pulse oximetry and re-evaluation of patient's condition.  Doug Sou, MD 09/28/12 4098  Doug Sou, MD 09/28/12 1209

## 2012-09-29 ENCOUNTER — Encounter (HOSPITAL_COMMUNITY): Payer: Self-pay

## 2012-09-29 ENCOUNTER — Observation Stay (HOSPITAL_COMMUNITY): Payer: BC Managed Care – PPO

## 2012-09-29 DIAGNOSIS — I309 Acute pericarditis, unspecified: Secondary | ICD-10-CM

## 2012-09-29 DIAGNOSIS — R109 Unspecified abdominal pain: Secondary | ICD-10-CM

## 2012-09-29 DIAGNOSIS — R197 Diarrhea, unspecified: Secondary | ICD-10-CM

## 2012-09-29 DIAGNOSIS — I319 Disease of pericardium, unspecified: Secondary | ICD-10-CM

## 2012-09-29 DIAGNOSIS — D72829 Elevated white blood cell count, unspecified: Secondary | ICD-10-CM

## 2012-09-29 DIAGNOSIS — D72825 Bandemia: Secondary | ICD-10-CM

## 2012-09-29 LAB — URINALYSIS, ROUTINE W REFLEX MICROSCOPIC
Bilirubin Urine: NEGATIVE
Hgb urine dipstick: NEGATIVE
Ketones, ur: NEGATIVE mg/dL
Nitrite: NEGATIVE
pH: 6 (ref 5.0–8.0)

## 2012-09-29 LAB — CBC
MCHC: 34.2 g/dL (ref 30.0–36.0)
Platelets: 510 10*3/uL — ABNORMAL HIGH (ref 150–400)
RDW: 13.1 % (ref 11.5–15.5)
WBC: 11.8 10*3/uL — ABNORMAL HIGH (ref 4.0–10.5)

## 2012-09-29 LAB — HEPATIC FUNCTION PANEL
ALT: 151 U/L — ABNORMAL HIGH (ref 0–53)
AST: 151 U/L — ABNORMAL HIGH (ref 0–37)
Total Protein: 7.1 g/dL (ref 6.0–8.3)

## 2012-09-29 LAB — BASIC METABOLIC PANEL
Chloride: 99 mEq/L (ref 96–112)
GFR calc Af Amer: 88 mL/min — ABNORMAL LOW (ref >90)
GFR calc non Af Amer: 76 mL/min — ABNORMAL LOW (ref >90)
Potassium: 3.5 mEq/L (ref 3.5–5.1)
Sodium: 135 mEq/L (ref 135–145)

## 2012-09-29 MED ORDER — COLCHICINE 0.6 MG PO TABS: 0.6000 mg | ORAL_TABLET | Freq: Every day | ORAL | Status: DC

## 2012-09-29 MED ORDER — MELOXICAM 15 MG PO TABS
15.0000 mg | ORAL_TABLET | Freq: Every day | ORAL | Status: DC
  Administered 2012-09-30 – 2012-10-01 (×2): 15 mg via ORAL
  Filled 2012-09-29 (×2): qty 1

## 2012-09-29 NOTE — Progress Notes (Addendum)
Discussed patient earlier with Dr. Jens Som for concern for markedly elevated WBC, >20% bands -- Dr. Shirlee Latch will in fact be the physician to see this patient today. In summary, this is a 31 y/o M who was admitted several days last week with pericarditis of unclear etiology with rising LFTs in 600-800 range and ?bili, ARF with Cr ~2.3. On Saturday 09/20/12, dropped his sats into the 70's requiring NRB. CXR that day showed ?PNA vs edema and enlarged heart --> stat echo showed moderate pericardial effusion with early hemodynamic compromise. Received IV Lasix with some improvement in sx as well as decreasing effusion by f/u CXR 10/23. However by discharge still was intermittently febrile.  Returned yesterday with acute EKG changes worrisome for STEMI so underwent cath which was normal. WBC 26k with increased neutrophils, >20% bands, toxic granulation and large platelets/thrombocytosis. (CBC had returned to normal 09/21/12.) Cr 1.56 which had improved but still elevated. May need to reconsider Mobic with renal fxn. Per preliminary discussion with Dr. Jens Som, will send off reculturing including blood cx, UA. Obtain CXR. Check quantiferon gold TB (had prelim AFB blood cx last week still pending). Will re-consult ID. ?Possibility of autoimmune etiology as well given LFT/renal involvement. ?W/u for Neisseria?   If CXR shows recurrent effusion, may need pericardiocentesis for assistance in dx.  Full cardio progress note to follow.

## 2012-09-29 NOTE — Progress Notes (Signed)
[address]            [place] [address]          [id number]       Michael [name] [facility] Medical Record #[id number] Date of Birth: [birth year]  Referring: No ref. provider found Primary Care: DEFAULT,PROVIDER, MD  Chief Complaint:    Chief Complaint  Patient presents with  . Code STEMI   chest pain, history of pericarditis  History of Present Illness:     Acute pericarditis treated with colchicine and discharged last week. Patient returned with further chest pain abdominal discomfort and nausea. EKG changes persisted and cardiac catheterization showed clean coronaries with LVEDP 12 mm mercury. Repeat 2-D echocardiogram done today shows moderate pericardial effusion with thickened pericardium. White count previous hospitalization has increased now to  over20,000 and patient has not received steroids. Concern over persistent active pericarditis, possible infectious, and concern patient is at risk for developing constriction   Current Activity/ Functional Status: Tolerating standard daily activities   Past Medical History  Diagnosis Date  . Elevated transaminase level/Chronic 09/17/2012    thes date back to age 45.   . Elevated blood pressure 09/17/2012  . Left ventricular hypertrophy 09/17/2012  . Acute pericarditis, unspecified 09/17/2012  . Obesity     on 09/18/12  BMI 35.6 weight 112.2 kg/247#    History reviewed. No pertinent past surgical history.  History  Smoking status  . Former Smoker  Smokeless tobacco  . Not on file    History  Alcohol Use  . Yes    History   Social History  . Marital Status: Married    Spouse Name: N/A    Number of Children: N/A  . Years of Education: N/A   Occupational History  . Not on file.   Social History Main Topics  . Smoking status: Former Games developer  . Smokeless tobacco: Not on file  . Alcohol Use: Yes  . Drug Use: Not on file  . Sexually Active:    Other Topics Concern  .  Not on file   Social History Narrative  . No narrative on file    No Known Allergies  Current Facility-Administered Medications  Medication Dose Route Frequency Provider Last Rate Last Dose  . 0.9 %  sodium chloride infusion   Intravenous Once Doug Sou, MD      . 0.9 %  sodium chloride infusion   Intravenous Continuous Lennette Bihari, MD 125 mL/hr at 09/29/12 0035 125 mL/hr at 09/29/12 0035  . acetaminophen (TYLENOL) tablet 650 mg  650 mg Oral Q4H PRN Lennette Bihari, MD   650 mg at 09/28/12 1423  . meloxicam (MOBIC) tablet 15 mg  15 mg Oral Daily Roger A Arguello, PA-C      . ondansetron (ZOFRAN) injection 4 mg  4 mg Intravenous Q6H PRN Lennette Bihari, MD      . DISCONTD: colchicine tablet 0.6 mg  0.6 mg Oral Daily Laurey Morale, MD      . DISCONTD: meloxicam [facility]) tablet 15 mg  15 mg Oral Daily Marinus Maw, MD   15 mg at 09/29/12 1050    Prescriptions prior to admission  Medication Sig Dispense Refill  . colchicine 0.6 MG tablet Take 0.6 mg by mouth 2 (two) times daily.      Marland Kitchen oxyCODONE-acetaminophen (PERCOCET/ROXICET) 5-325 MG per tablet Take  1 tablet by mouth every 8 (eight) hours as needed. For pain        No family history on file.   Review of Systems:     Cardiac Review of Systems: Y or N  Chest Pain [ y   ]  Resting SOB [  n ] Exertional SOB  [ y ]  Orthopnea [  ]   Pedal Edema Cove.Etienne   ]    Palpitations Milo.Brash  ] Syncope  [  ]   Presyncope [  n ]  General Review of Systems: [Y] = yes [  ]=no Constitional: recent weight change [ y ]; anorexia [  ]; fatigue [  ]; nausea [ y ]; night sweats Cove.Etienne  ]; fever [n  ]; or chills [n  ];                                                                                                                                          Dental: poor dentition[  ]; Last Dentist visit: 1 yr  Eye : blurred vision [  ]; diplopia [   ]; vision changes [  ];  Amaurosis fugax[  ]; Resp: cough [ y ];  wheezing[  ];  hemoptysis[ n ]; shortness of breath[   ]; paroxysmal nocturnal dyspnea[  ]; dyspnea on exertion[  ]; or orthopnea[  ];  GI:  gallstones[  ], vomiting[  ];  dysphagia[  ]; melena[  ];  hematochezia [  ]; heartburn[y  ];   Hx of  Colonoscopy[  ]; GU: kidney stones [  ]; hematuria[  ];   dysuria [  ];  nocturia[  ];  history of     obstruction [  ];             Skin: rash, swelling[  ];, hair loss[  ];  peripheral edema[  ];  or itching[  ]; Musculosketetal: myalgias[  ];  joint swelling[  ];  joint erythema[  ];  joint pain[  ];  back pain[  ];  Heme/Lymph: bruising[  ];  bleeding[  ];  anemia[  ];  Neuro: TIA[  ];  headaches[  ];  stroke[  ];  vertigo[  ];  seizures[  ];   paresthesias[  ];  difficulty walking[  ];  Psych:depression[  ]; anxiety[  ];  Endocrine: diabetes[  ];  thyroid dysfunction[  ];  Immunizations: Flu [  ]; Pneumococcal[  ];  Other:  Physical Exam: BP 102/65  Pulse 81  Temp 98.4 F (36.9 C) (Oral)  Resp 18  Ht 5\' 10"  (1.778 m)  Wt 230 lb 1.6 oz (104.373 kg)  BMI 33.02 kg/m2  SpO2 95% Gen. middle-aged black male no acute distress HEENT normocephalic Neck without JVD mass or adenopathy Thorax diminished breath sounds left base otherwise clear Cardiac regular rhythm without murmur or rub Abdomen moderate  distention no guarding bowel sounds present Extremities warm without edema or tenderness Neuro alert responsive no focal deficit   Diagnostic Studies & Laboratory data:   Radiographic study, 2-D echo, cardiac cath reviewed  Recent Radiology Findings:   Dg Chest 2 View  09/29/2012  *RADIOLOGY REPORT*  Clinical Data: Chest pain and irregular heart rate.  CHEST - 2 VIEW  Comparison: 09/21/2012  Findings: Stable cardiac enlargement.  Edema pattern has improved since the prior study with some increased prominence of pulmonary vascularity remaining.  There also is a small left pleural effusion.  IMPRESSION: Stable cardiomegaly and decrease in pulmonary edema.  Small left pleural effusion present.    Original Report Authenticated By: Reola Calkins, M.D.       Recent Lab Findings: Lab Results  Component Value Date   WBC 11.8* 09/29/2012   HGB 11.7* 09/29/2012   HCT 34.2* 09/29/2012   PLT 510* 09/29/2012   GLUCOSE 102* 09/29/2012   ALT 151* 09/29/2012   AST 151* 09/29/2012   NA 135 09/29/2012   K 3.5 09/29/2012   CL 99 09/29/2012   CREATININE 1.24 09/29/2012   BUN 18 09/29/2012   CO2 26 09/29/2012   TSH 9.958* 09/20/2012   INR 1.41 09/28/2012      Assessment / Plan:      Persistent pericarditis with moderate effusion. No clear evidence of tamponade but concern over progression to constriction. Agree with plan for pericardial window, drainage, and obtain tissue for culture. If The tissue cultures negative patient probably benefit from a course of steroids.  Plan pericardial window and biopsy later this week      @me1 @ 09/29/2012 6:21 PM

## 2012-09-29 NOTE — Progress Notes (Signed)
  Echocardiogram 2D Echocardiogram has been performed.  Jorje Guild 09/29/2012, 2:55 PM

## 2012-09-29 NOTE — Consult Note (Addendum)
[facility]     Reason for Consult: leukocytosis    Referring Physician: Dr. Jens Som  Principal Problem:  *Acute pericarditis, unspecified Active Problems:  Elevated transaminase level/Chronic  Hypertension  Acute kidney failure, unspecified      . sodium chloride   Intravenous Once  . colchicine  0.6 mg Oral Daily  . DISCONTD: meloxicam  15 mg Oral Daily    Recommendations: Repeat CBC with diff in am Check anti-DNAse, Streptolysin Echo (awaiting results)  If pericardiocentesis is considered, he should get fluid for cell count with differential, protein, adenosine deaminase, AFB, fungal and bacterial cultures. Avoid colchicine if possible  Assessment: From the history, it appears that his symptoms are likely related to his colchicine.  He had crampy abdominal pain, n/v.  His CXR shows good resolution of the pleural effusions.  His leukocytosis with bandemia is puzzling but he certainly does not appear toxic at all, no fever and no signs of any active infection.  Overall, he clinically seems to be continuing to improve from the perspective of his pericarditis.  Autoimmune certainly a possible cause but ANA is negative making SLE very unlikely, no signs of Sjogrens, possible MCTD but also ANA should be positive and no arthritis.  Rheumatic fever a remote possibility.    Antibiotics: none  HPI: Michael [name] is a 31 y.o. male with a history of chronic transaminitis who initially presented to cardiology earlier this month with fever and chest pain and echo revealed pericarditis. He was admitted to [facility] and underwent evaluation for pericarditis.  He was noted to have pleural and pericardial effusions.  He was febrile but WBC improved, his pericardial effusion nearly resolved and he was discharged on colchicine.  He had an element of acute renal injury so NSAIDS were appropriately avoided.  He took his colchicine but about 1-2 days prior to presentation to  urgent care, he developed crampy abdominal pain, n/v.  No sick contacts, no travel.  He presented to urgent care and notably had ECG changes and sent to [facility] where he underwent catheterization, which was negative. No fever, chills.     Review of Systems: Pertinent items are noted in HPI.  Past Medical History  Diagnosis Date  . Elevated transaminase level/Chronic 09/17/2012    thes date back to age 74.   . Elevated blood pressure 09/17/2012  . Left ventricular hypertrophy 09/17/2012  . Acute pericarditis, unspecified 09/17/2012  . Obesity     on 09/18/12  BMI 35.6 weight 112.2 kg/247#    History  Substance Use Topics  . Smoking status: Former Games developer  . Smokeless tobacco: Not on file  . Alcohol Use: Yes    No family history on file. No Known Allergies  OBJECTIVE: Blood pressure 102/65, pulse 81, temperature 98.4 F (36.9 C), temperature source Oral, resp. rate 18, height 5\' 10"  (1.778 m), weight 230 lb 1.6 oz (104.373 kg), SpO2 95.00%. General: alert, oriented x 3 Skin: no rashes Lungs: CTA B Cor: RRR without m/r/g Abdomen: soft, ntnd, +hyperactive bowel sounds Ext: no edema  Microbiology: Recent Results (from the past 240 hour(s))  CULTURE, BLOOD (ROUTINE X 2)     Status: Normal   Collection Time   09/19/12  4:26 PM      Component Value Range Status Comment   Specimen Description BLOOD RIGHT HAND   Final    Special Requests BOTTLES DRAWN AEROBIC AND ANAEROBIC 10CC   Final    Culture  Setup Time 09/20/2012  01:38   Final    Culture     Final    Value: DIPHTHEROIDS(CORYNEBACTERIUM SPECIES)     Note: Standardized susceptibility testing for this organism is not available.     Note: Gram Stain Report Called to,Read Back By and Verified With: DANA SULLIVAN 12:20PM 09/21/12 BY GUSTK   Report Status 09/22/2012 FINAL   Final   AFB CULTURE, BLOOD     Status: Normal (Preliminary result)   Collection Time   09/19/12  4:26 PM      Component Value Range Status Comment   Specimen  Description BLOOD RIGHT HAND   Final    Special Requests ACID FAST BACILLI 5CC   Final    Culture     Final    Value: CULTURE WILL BE EXAMINED FOR 6 WEEKS BEFORE ISSUING A FINAL REPORT   Report Status PENDING   Incomplete   CULTURE, BLOOD (ROUTINE X 2)     Status: Normal   Collection Time   09/19/12  4:39 PM      Component Value Range Status Comment   Specimen Description BLOOD RIGHT ARM   Final    Special Requests BOTTLES DRAWN AEROBIC AND ANAEROBIC 10CC   Final    Culture  Setup Time 09/20/2012 02:06   Final    Culture NO GROWTH 5 DAYS   Final    Report Status 09/26/2012 FINAL   Final   FUNGUS CULTURE, BLOOD     Status: Normal   Collection Time   09/19/12  4:39 PM      Component Value Range Status Comment   Specimen Description BLOOD RIGHT ARM   Final    Special Requests BOTTLES DRAWN AEROBIC AND ANAEROBIC 10CC   Final    Culture NO GROWTH 7 DAYS   Final    Report Status 09/28/2012 FINAL   Final   MRSA PCR SCREENING     Status: Normal   Collection Time   09/20/12  8:36 PM      Component Value Range Status Comment   MRSA by PCR NEGATIVE  NEGATIVE Final     Staci Righter, MD [facility] [phone] pager  [phone] cell 09/29/2012, 3:58 PM

## 2012-09-29 NOTE — Progress Notes (Signed)
Patient Name: Michael [name] Date of Encounter: 09/29/2012     Principal Problem:  *Acute pericarditis, unspecified Active Problems:  Elevated transaminase level/Chronic  Hypertension  Acute kidney failure, unspecified  Leukocytosis  Bandemia  Abdominal pain  Diarrhea    SUBJECTIVE: Feels well. No groin site issues. No chest pain, sob, lightheadedness, PND, orthopnea or LE edema.    OBJECTIVE  Filed Vitals:   09/28/12 1312 09/29/12 0439 09/29/12 0625 09/29/12 1408  BP: 125/71 126/80  102/65  Pulse: 96 86  81  Temp: 99.5 F (37.5 C) 98.8 F (37.1 C)  98.4 F (36.9 C)  TempSrc: Oral Oral  Oral  Resp: 18 20  18   Height: 5\' 10"  (1.778 m)     Weight: 110.8 kg (244 lb 4.3 oz)  104.373 kg (230 lb 1.6 oz)   SpO2: 97% 96%  95%    Intake/Output Summary (Last 24 hours) at 09/29/12 1625 Last data filed at 09/29/12 1230  Gross per 24 hour  Intake    600 ml  Output      0 ml  Net    600 ml   Weight change:   PHYSICAL EXAM  General: Well developed, well nourished, in no acute distress. Head: Normocephalic, atraumatic, sclera non-icteric, no xanthomas, nares are without discharge.  Neck: Supple without bruits, JVP 9-10 cm.  Lungs:  Resp regular and unlabored, decreased breath sounds LLL, dullness to percussion Heart: RRR no s3, s4, or murmurs.  No friction rub.  Abdomen: Soft, non-tender, non-distended, BS + x 4.  Msk:  Strength and tone appears normal for age. Extremities: No clubbing, cyanosis or edema. DP/PT/Radials 2+ and equal bilaterally. Neuro: Alert and oriented X 3. Moves all extremities spontaneously. Psych:  Normal affect.  LABS:  Recent Labs  Basename 09/28/12 1110   WBC 26.5*   HGB 12.7*   HCT 37.2*   MCV 86.1   PLT 587*    Lab 09/28/12 1110 09/25/12 0525 09/24/12 0535  NA 132* 136 148*  K 4.4 4.0 5.5*  CL 93* 96 103  CO2 25 28 30   BUN 24* 16 20  CREATININE 1.57* 1.65* 1.74*  CALCIUM 10.0 9.7 9.9  PROT -- -- --  BILITOT -- -- --    ALKPHOS -- -- --  ALT -- -- --  AST -- -- --  AMYLASE -- -- --  LIPASE -- -- --  GLUCOSE 156* 122* 115*   TELE: NSR  Radiology/Studies:  Dg Chest 2 View  09/29/2012  *RADIOLOGY REPORT*  Clinical Data: Chest pain and irregular heart rate.  CHEST - 2 VIEW  Comparison: 09/21/2012  Findings: Stable cardiac enlargement.  Edema pattern has improved since the prior study with some increased prominence of pulmonary vascularity remaining.  There also is a small left pleural effusion.  IMPRESSION: Stable cardiomegaly and decrease in pulmonary edema.  Small left pleural effusion present.   Original Report Authenticated By: Reola Calkins, M.D.    Dg Chest 2 View  09/21/2012  *RADIOLOGY REPORT*  Clinical Data: Evaluate for edema versus infection  CHEST - 2 VIEW  Comparison: 09/20/2012  Findings: Heart size appears normal. There are bilateral pleural effusions left greater than right. Persistent bilateral airspace opacities are again noted, right greater than left.  Not significantly improved from previous exam.  IMPRESSION:  1.  No significant change in bilateral airspace opacities and pleural effusions.   Original Report Authenticated By: Rosealee Albee, M.D.    Dg Chest 2 View  09/20/2012  *  RADIOLOGY REPORT*  Clinical Data: Shortness of breath.  CHEST - 2 VIEW  Comparison: Same day.  Findings: Mild bilateral pleural effusions are noted. Cardiomediastinal silhouette appears normal.  Right upper and lower lobe opacities are again noted and unchanged compared to prior exam.  Left basilar opacity is also noted which is unchanged.  IMPRESSION: Bilateral lung opacities and pleural effusions are again noted and unchanged; these findings consistent with bilateral pneumonia or possibly edema.   Original Report Authenticated By: Venita Sheffield., M.D.    Dg Chest 2 View  09/18/2012  *RADIOLOGY REPORT*  Clinical Data: Mid chest pain.  Shortness of breath.  Fever.  CHEST - 2 VIEW  Comparison: 09/18/2012  at 0557 hours the  Findings: Shallow inspiration.  Cardiac enlargement with borderline pulmonary vascularity.  Infiltration or atelectasis in the lung bases.  No blunting of costophrenic angles.  No pneumothorax.  IMPRESSION: Cardiac enlargement.  Shallow inspiration with infiltration or atelectasis in the lung bases.   Original Report Authenticated By: Marlon Pel, M.D.    Dg Chest 2 View  09/16/2012  *RADIOLOGY REPORT*  Clinical Data: Mid to chest pain and shortness of breath.  CHEST - 2 VIEW  Comparison: 11/09/2010  Findings: Shallow inspiration. The heart size and pulmonary vascularity are normal. The lungs appear clear and expanded without focal air space disease or consolidation. No blunting of the costophrenic angles.  No pneumothorax.  Mediastinal contours appear intact.  No significant change since previous study.  IMPRESSION: No evidence of active pulmonary disease.   Original Report Authenticated By: Marlon Pel, M.D.    Ct Angio Chest Pe W/cm &/or Wo Cm  09/17/2012  *RADIOLOGY REPORT*  Clinical Data: Low grade fever.  Pleurisy.  Rule out pulmonary embolism.  Tachycardia.  CT ANGIOGRAPHY CHEST  Technique:  Multidetector CT imaging of the chest using the standard protocol during bolus administration of intravenous contrast. Multiplanar reconstructed images including MIPs were obtained and reviewed to evaluate the vascular anatomy.  Contrast: OMNIPAQUE IOHEXOL 350 MG/ML SOLN  Comparison: Plain film of 1 day prior.  No prior CT.  Findings: Lung windows demonstrate minimal motion degradation. Nonspecific bronchial wall thickening to the right lower lobe. Minimal ground-glass opacity at the central right lower lobe is favored to be due to subsegmental atelectasis.  Soft tissue windows:  The quality of this exam for evaluation of pulmonary embolism is moderate.  In addition to mild motion artifact, the bolus is suboptimally timed, with a large amount contrast remaining in the SVC.   No evidence of pulmonary embolism to the large segmental level.  Bovine arch. Normal aortic caliber without dissection.  Mild cardiomegaly.  Trace right greater than left pleural fluid, possibly physiologic.  Upper normal subcarinal nodal tissue 1.1 cm. Mild azygo-esophageal recess adenopathy at 1.1 cm on image 39.  No hilar adenopathy. The lower thoracic esophagus is mildly dilated with a fluid level within.  Residual thymic tissue in the anterior mediastinum.  Limited abdominal imaging demonstrates no significant findings.  No acute osseous abnormality.  IMPRESSION:  1.  Moderate quality exam.  No evidence of pulmonary embolism to the large segmental level. 2.  Mild cardiomegaly with trace bilateral pleural fluid.  This could be physiologic or relate to mild fluid overload. 3.  Prominent mediastinal nodes, favored to be reactive.  Consider follow-up chest CT at 3 - 6 months. 4.  Mildly dilated lower thoracic esophagus.  Question dysmotility or gastroesophageal reflux.   Original Report Authenticated By: Karn Cassis.  Reche Dixon, M.D.    US Abdomen Complete  09/18/2012  *RADIOLOGY REPORT*  Clinical Data:  Elevated LFTs.  COMPLETE ABDOMINAL ULTRASOUND  Comparison:  Chest CT 09/17/2012  Findings:  Gallbladder:  No stones or wall thickening.  Negative sonographic Murphy's.  Common bile duct:   Normal caliber, 3 mm.  Liver:  No focal lesion identified.  Within normal limits in parenchymal echogenicity.  IVC:  Appears normal.  Pancreas:  No focal abnormality seen.  Spleen:  Within normal limits in size and echotexture.  Right Kidney:   Normal in size and parenchymal echogenicity.  No evidence of mass or hydronephrosis.  Left Kidney:  Normal in size and parenchymal echogenicity.  No evidence of mass or hydronephrosis.  Abdominal aorta:  No aneurysm identified.  Small bilateral pleural effusions noted.  IMPRESSION: No acute findings.   Original Report Authenticated By: Cyndie Chime, M.D.    Dg Chest Port 1  View  09/20/2012  *RADIOLOGY REPORT*  Clinical Data: Hypoxia  PORTABLE CHEST - 1 VIEW  Comparison: 09/18/2012  Findings: Interval development of extensive bilateral airspace disease in the lung bases.  This may be pneumonia or edema. Hypoventilation with decreased lung volume.  No significant pleural effusion.  IMPRESSION: Interval development of extensive bibasilar airspace disease which may represent pneumonia or pulmonary edema.   Original Report Authenticated By: Camelia Phenes, M.D.    Dg Chest Port 1 View  09/18/2012  *RADIOLOGY REPORT*  Clinical Data: Shortness of breath and chest pain.  PORTABLE CHEST - 1 VIEW  Comparison: 09/16/2012  Findings: Shallow inspiration.  Increased density in the right lung base since previous study probably represents atelectasis. Pneumonia not excluded.  Heart size and pulmonary vascularity are normal for technique.  No blunting of costophrenic angles.  No pneumothorax.  Mediastinal contours appear intact.  IMPRESSION: Shallow inspiration with infiltration or atelectasis in the right lung base, new since previous study.   Original Report Authenticated By: Marlon Pel, M.D.     Current Medications:     . sodium chloride   Intravenous Once  . meloxicam  15 mg Oral Daily  . DISCONTD: colchicine  0.6 mg Oral Daily  . DISCONTD: meloxicam  15 mg Oral Daily    ASSESSMENT:  1. Pericarditis 2. Leukocytosis + bandemia 3. Acute renal failure 4. Abdominal pain 5. Diarrhea 6. HTN 7. Elevated LFTs, chronic  DISCUSSION/PLAN:  Patient admitted yesterday after cath for concern of STEMI. He has a recent history of pericarditis c/b ARF, pericardial effusion + early tamponade features and chronically elevated LFTs. Renal function improved, and he was discharged on colchicine. He notes abdominal pain and profuse diarrhea since starting this, thus prompting his urgent care visit. Today, CBC reveals worsening leukocytosis with bandemia, temp at ULN, thrombocytosis  with large PLTs on smear. ID has been consulted. Echo review suggests organizing pericardial effusion when compared to prior studies. 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 Continue meloxicam with close monitoring of renal function. Re: abdominal pain and diarrhea- likely an AE of colchicine. This has been discontinued. Will get stool culture given leukocytosis + bandemia. Further infectious recs per ID.   Signed, R. Hurman Horn, PA-C 09/29/2012, 4:25 PM  Patient seen with PA, agree with the above note.  Patient feels back to normal now that he has stopped colchicine.   1. Pericardial disease: Patient presented at prior hospitalization with symptoms and ECG consistent with acute pericarditis.  NSAIDs  were not started due to ARF and he was put on colchicine.  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  He returned yesterday with nausea/vomiting/diarrhea and ECG was similar to prior (suggesting acute pericarditis).  He actually ended up getting a cath yesterday for STEMI that showed normal coronaries.  On labs, WBCs were much higher compared to discharge: up from 8K to 26K with bandemia.  He has not been on steroids.  I reviewed today's echo.  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   Rheum workup has been negative so far.  - Agree with stopping colchicine due to GI symptoms and beginning Mobic for anti-inflammatory treatment.  Will monitor renal function carefully, creatinine at 1.5 is better compared to prior admission.  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  I am concerned that he will progress to frank constriction.  JVP is elevated (stop IV fluid).  I do not think that he has a good target for pericardiocentesis.  Would continue  anti-inflammatory treatment (now with Mobic) and monitor closely in the future for symptoms.  Will need followup echo as outpatient.  2. ID: Persistent fever at last admission with acute pericarditis.  He has not been febrile this admission so far.  However, WBCs have jumped considerably with bandemia this admission.  I would be concerned for possible purulent pericarditis but he does not appear toxic.  Will monitor overnight to watch for fever and repeat WBCs in the morning.  Ideally, would get a sample of pericardial fluid but I do not think that there is a target given organization.  Would like evaluation by CVTS, will need to discuss pericardial biopsy. Will need to involve ID.   Marca Ancona 09/29/2012 5:16 PM

## 2012-09-30 DIAGNOSIS — I219 Acute myocardial infarction, unspecified: Secondary | ICD-10-CM

## 2012-09-30 DIAGNOSIS — I309 Acute pericarditis, unspecified: Secondary | ICD-10-CM

## 2012-09-30 LAB — COMPREHENSIVE METABOLIC PANEL
ALT: 227 U/L — ABNORMAL HIGH (ref 0–53)
AST: 210 U/L — ABNORMAL HIGH (ref 0–37)
CO2: 23 mEq/L (ref 19–32)
Chloride: 100 mEq/L (ref 96–112)
Creatinine, Ser: 1.16 mg/dL (ref 0.50–1.35)
GFR calc non Af Amer: 82 mL/min — ABNORMAL LOW (ref >90)
Sodium: 138 mEq/L (ref 135–145)
Total Bilirubin: 0.9 mg/dL (ref 0.3–1.2)

## 2012-09-30 LAB — CBC WITH DIFFERENTIAL/PLATELET
Basophils Relative: 0 % (ref 0–1)
Eosinophils Absolute: 0.2 10*3/uL (ref 0.0–0.7)
MCH: 29.3 pg (ref 26.0–34.0)
MCHC: 33.8 g/dL (ref 30.0–36.0)
Neutrophils Relative %: 72 % (ref 43–77)
Platelets: 560 10*3/uL — ABNORMAL HIGH (ref 150–400)
RDW: 13.1 % (ref 11.5–15.5)

## 2012-09-30 LAB — URINE CULTURE
Colony Count: NO GROWTH
Culture: NO GROWTH

## 2012-09-30 LAB — TROPONIN I: Troponin I: <0.3 ng/mL (ref <0.30)

## 2012-09-30 LAB — ANTISTREPTOLYSIN O TITER: ASO: 46 IU/mL (ref <409)

## 2012-09-30 MED ORDER — COLCHICINE 0.6 MG PO TABS
0.6000 mg | ORAL_TABLET | Freq: Every day | ORAL | Status: DC
  Administered 2012-09-30 – 2012-10-01 (×2): 0.6 mg via ORAL
  Filled 2012-09-30 (×2): qty 1

## 2012-09-30 NOTE — Progress Notes (Signed)
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.5 but with normalization of leukocytosis to 11.7 2-D echo shows moderate pericardial effusion with fibrinous material Plan subxiphoid pericardial window October 31 with cultures and adenosine deaminase level to rule out TB  Objective: Vital signs in last 24 hours: Temp:  [98.4 F (36.9 C)-101.5 F (38.6 C)] 99.5 F (37.5 C) (10/29 0700) Pulse Rate:  [81-99] 93  (10/29 0700) Cardiac Rhythm:  [-] Normal sinus rhythm (10/29 0809) Resp:  [18-20] 20  (10/28 2200) BP: (102-132)/(65-81) 114/76 mmHg (10/29 0700) SpO2:  [95 %-97 %] 97 % (10/29 0700)  Hemodynamic parameters for last 24 hours:   sinus rhythm  Intake/Output from previous day: 10/28 0701 - 10/29 0700 In: 840 [P.O.:840] Out: -  Intake/Output this shift: Total I/O In: 240 [P.O.:240] Out: -   Lungs clear No chest wall tenderness  Lab Results:  [facility] 09/30/12 0625 09/29/12 1641  WBC 11.7* 11.8*  HGB 11.0* 11.7*  HCT 32.5* 34.2*  PLT 560* 510*   BMET:  Basename 09/30/12 0625 09/29/12 1641  NA 138 135  K 3.5 3.5  CL 100 99  CO2 23 26  GLUCOSE 90 102*  BUN 14 18  CREATININE 1.16 1.24  CALCIUM 8.9 9.1    PT/INR:  Basename 09/28/12 1110  LABPROT 16.9*  INR 1.41   ABG    Component Value Date/Time   TCO2 26 09/18/2012 0551   CBG (last 3)  No results found for this basename: GLUCAP:3 in the last 72 hours  Assessment/Plan: S/P Procedure(s) (LRB): LEFT HEART CATHETERIZATION WITH CORONARY ANGIOGRAM (N/A) Pericardial window October 31   LOS: 2 days    VAN TRIGT III,Sai Moura 09/30/2012

## 2012-09-30 NOTE — Progress Notes (Signed)
[facility]  Date of Admission:  09/28/2012  Antibiotics: none  Subjective: Feels well, no chest pain, did not notice fever  Objective: Temp:  [98.4 F (36.9 C)-101.5 F (38.6 C)] 99.5 F (37.5 C) (10/29 0700) Pulse Rate:  [81-99] 93  (10/29 0700) Resp:  [18-20] 20  (10/28 2200) BP: (102-132)/(65-81) 114/76 mmHg (10/29 0700) SpO2:  [95 %-97 %] 97 % (10/29 0700)  General: Awake, alert, nad Skin: no rashes Lungs: CTA B Cor: RRR without rub Abdomen: soft, ntnd, +bs Joints: no arthritis  Lab Results Lab Results  Component Value Date   WBC 11.7* 09/30/2012   HGB 11.0* 09/30/2012   HCT 32.5* 09/30/2012   MCV 86.4 09/30/2012   PLT 560* 09/30/2012    Lab Results  Component Value Date   CREATININE 1.16 09/30/2012   BUN 14 09/30/2012   NA 138 09/30/2012   K 3.5 09/30/2012   CL 100 09/30/2012   CO2 23 09/30/2012    Lab Results  Component Value Date   ALT 227* 09/30/2012   AST 210* 09/30/2012   ALKPHOS 440* 09/30/2012   BILITOT 0.9 09/30/2012      Microbiology: Recent Results (from the past 240 hour(s))  MRSA PCR SCREENING     Status: Normal   Collection Time   09/20/12  8:36 PM      Component Value Range Status Comment   MRSA by PCR NEGATIVE  NEGATIVE Final   CULTURE, BLOOD (ROUTINE X 2)     Status: Normal (Preliminary result)   Collection Time   09/29/12  4:03 PM      Component Value Range Status Comment   Specimen Description BLOOD LEFT ARM   Final    Special Requests BOTTLES DRAWN AEROBIC AND ANAEROBIC 10CC   Final    Culture  Setup Time 09/29/2012 22:11   Final    Culture     Final    Value:        BLOOD CULTURE RECEIVED NO GROWTH TO DATE CULTURE WILL BE HELD FOR 5 DAYS BEFORE ISSUING A FINAL NEGATIVE REPORT   Report Status PENDING   Incomplete   CULTURE, BLOOD (ROUTINE X 2)     Status: Normal (Preliminary result)   Collection Time   09/29/12  4:42 PM      Component Value Range Status Comment   Specimen Description  BLOOD LEFT ARM   Final    Special Requests BOTTLES DRAWN AEROBIC AND ANAEROBIC 10CC   Final    Culture  Setup Time 09/29/2012 22:12   Final    Culture     Final    Value:        BLOOD CULTURE RECEIVED NO GROWTH TO DATE CULTURE WILL BE HELD FOR 5 DAYS BEFORE ISSUING A FINAL NEGATIVE REPORT   Report Status PENDING   Incomplete     Studies/Results: Dg Chest 2 View  09/29/2012  *RADIOLOGY REPORT*  Clinical Data: Chest pain and irregular heart rate.  CHEST - 2 VIEW  Comparison: 09/21/2012  Findings: Stable cardiac enlargement.  Edema pattern has improved since the prior study with some increased prominence of pulmonary vascularity remaining.  There also is a small left pleural effusion.  IMPRESSION: Stable cardiomegaly and decrease in pulmonary edema.  Small left pleural effusion present.   Original Report Authenticated By: Reola Calkins, M.D.     Assessment/Plan: 1) pericarditis - WBC has normalized so likely elevated WBC reactive vs lab error.  Echo does show effusion  and plan for pericardial drainage.   -send fluid for ADENOSINE DEAMINASE (TO R/O TB), BACTERIAL, FUNGAL, AFB CULTURES, CELL COUNT AND DIFFERENTIAL, PROTEIN along with other studies you are doing.   -Quantiferon test not likely helpful - negative does not rule it out and positive does not rule it in.    Staci Righter, MD [facility] [phone] pager   09/30/2012, 11:10 AM

## 2012-09-30 NOTE — Progress Notes (Signed)
Patient Name: Michael [name]      SUBJECTIVE:* no further cp and no stomach pains  Past Medical History  Diagnosis Date  . Elevated transaminase level/Chronic 09/17/2012    thes date back to age 31.   . Elevated blood pressure 09/17/2012  . Left ventricular hypertrophy 09/17/2012  . Acute pericarditis, unspecified 09/17/2012  . Obesity     on 09/18/12  BMI 35.6 weight 112.2 kg/247#    PHYSICAL EXAM Filed Vitals:   09/29/12 0625 09/29/12 1408 09/29/12 2200 09/30/12 0700  BP:  102/65 132/81 114/76  Pulse:  81 99 93  Temp:  98.4 F (36.9 C) 101.5 F (38.6 C) 99.5 F (37.5 C)  TempSrc:  Oral Oral Oral  Resp:  18 20   Height:      Weight: 230 lb 1.6 oz (104.373 kg)     SpO2:  95% 96% 97%    Well developed and nourished in no acute distress HENT normal Neck supple with JVP-8-10 with + Kussmaul and sharp x and y Carotids brisk and full without bruits Clear Regular rate and rhythm, no murmurs or gallops Abd-soft with active BS without hepatomegaly No Clubbing cyanosis edema Skin-warm and dry A & Oriented  Grossly normal sensory and motor function   TELEMETRY: Reviewed telemetry pt in nsr    Intake/Output Summary (Last 24 hours) at 09/30/12 0728 Last data filed at 09/29/12 1700  Gross per 24 hour  Intake    840 ml  Output      0 ml  Net    840 ml    LABS: Basic Metabolic Panel:  Lab 09/29/12 1610 09/28/12 1110 09/25/12 0525 09/24/12 0535  NA 135 132* 136 148*  K 3.5 4.4 4.0 5.5*  CL 99 93* 96 103  CO2 26 25 28 30   GLUCOSE 102* 156* 122* 115*  BUN 18 24* 16 20  CREATININE 1.24 1.57* 1.65* 1.74*  CALCIUM 9.1 10.0 -- --  MG -- -- -- --  PHOS -- -- -- --   Cardiac Enzymes: No results found for this basename: CKTOTAL:3,CKMB:3,CKMBINDEX:3,TROPONINI:3 in the last 72 hours CBC:  Lab 09/29/12 1641 09/28/12 1110  WBC 11.8* 26.5*  NEUTROABS -- 24.1*  HGB 11.7* 12.7*  HCT 34.2* 37.2*  MCV 86.4 86.1  PLT 510* 587*   PROTIME:  Basename 09/28/12 1110   LABPROT 16.9*  INR 1.41   Liver Function Tests:  Basename 09/29/12 1556  AST 151*  ALT 151*  ALKPHOS 418*  BILITOT 0.8  PROT 7.1  ALBUMIN 2.5*      ASSESSMENT AND PLAN:  Patient Active Hospital Problem List: Acute pericarditis, unspecified (09/29/2012)   Elevated transaminase level/Chronic (09/17/2012)   Hypertension (09/18/2012)   Acute kidney failure, unspecified (09/22/2012)   Leukocytosis (09/29/2012)   Abdominal pain (09/29/2012)   Diarrhea (09/29/2012)     Very complicated problem with persistent pericarditis with GI sx likely 2/2 colchicine used 2/2 to renal toxicity concurrent with NSAIDs.  Now on Mobic. With stable renal function Discussions re pericardial window for bx and culture for diagnosis. Not sure if it has impact on long term likelihood of constriction  Will try and get up with somebody at [facility] to try and think through persistent fever and glean insight as to whether there is any impact of pericardial ":flushing" to decrease risk of constriction Will resume colchine at once a day and see as the data are powerful to decrease likelihood of chronic relapsing pericarditis  aprreciate all input though still not  clear what all is going on and where feveris coming from  Signed, Sherryl Manges MD  09/30/2012

## 2012-10-01 ENCOUNTER — Inpatient Hospital Stay (HOSPITAL_COMMUNITY): Payer: BC Managed Care – PPO

## 2012-10-01 ENCOUNTER — Other Ambulatory Visit: Payer: Self-pay | Admitting: *Deleted

## 2012-10-01 DIAGNOSIS — I10 Essential (primary) hypertension: Secondary | ICD-10-CM

## 2012-10-01 DIAGNOSIS — I309 Acute pericarditis, unspecified: Secondary | ICD-10-CM

## 2012-10-01 DIAGNOSIS — I517 Cardiomegaly: Secondary | ICD-10-CM

## 2012-10-01 DIAGNOSIS — R7401 Elevation of levels of liver transaminase levels: Secondary | ICD-10-CM

## 2012-10-01 DIAGNOSIS — D72829 Elevated white blood cell count, unspecified: Secondary | ICD-10-CM

## 2012-10-01 LAB — ECHOVIRUS ABS PANEL (CSF)
Echovirus Ab Type 11: <1:10 {titer}
Echovirus Ab Type 9: <1:10 {titer}

## 2012-10-01 MED ORDER — COLCHICINE 0.6 MG PO TABS: 0.6000 mg | ORAL_TABLET | Freq: Every day | ORAL | Status: DC

## 2012-10-01 MED ORDER — ASPIRIN EC 325 MG PO TBEC: 650.0000 mg | DELAYED_RELEASE_TABLET | Freq: Four times a day (QID) | ORAL | Status: DC

## 2012-10-01 NOTE — Progress Notes (Addendum)
Patient Name: Michael [name]      SUBJECTIVE:* no further cp and no stomach pains  Past Medical History  Diagnosis Date  . Elevated transaminase level/Chronic 09/17/2012    thes date back to age 31.   . Elevated blood pressure 09/17/2012  . Left ventricular hypertrophy 09/17/2012  . Acute pericarditis, unspecified 09/17/2012  . Obesity     on 09/18/12  BMI 35.6 weight 112.2 kg/247#    PHYSICAL EXAM Filed Vitals:   09/30/12 1316 09/30/12 2018 10/01/12 0642 10/01/12 1336  BP: 114/76 131/67 125/78 119/68  Pulse: 88 104 102 100  Temp: 98.9 F (37.2 C) 99.8 F (37.7 C) 98 F (36.7 C) 99 F (37.2 C)  TempSrc: Oral Oral Oral Oral  Resp: 19 18 18 18   Height:      Weight:   231 lb 6.4 oz (104.962 kg)   SpO2: 98% 97% 98% 98%    Well developed and nourished in no acute distress HENT normal Neck supple with JVP-5-7   Carotids brisk and full without bruits Clear Regular rate and rhythm, no murmurs or gallops Abd-soft with active BS without hepatomegaly No Clubbing cyanosis edema Skin-warm and dry A & Oriented  Grossly normal sensory and motor function   TELEMETRY: Reviewed telemetry pt in nsr    Intake/Output Summary (Last 24 hours) at 10/01/12 1343 Last data filed at 10/01/12 1230  Gross per 24 hour  Intake    720 ml  Output   2000 ml  Net  -1280 ml    LABS: Basic Metabolic Panel:  Lab 09/30/12 4782 09/29/12 1641 09/28/12 1110 09/25/12 0525  NA 138 135 132* 136  K 3.5 3.5 4.4 4.0  CL 100 99 93* 96  CO2 23 26 25 28   GLUCOSE 90 102* 156* 122*  BUN 14 18 24* 16  CREATININE 1.16 1.24 1.57* 1.65*  CALCIUM 8.9 9.1 -- --  MG -- -- -- --  PHOS -- -- -- --   Cardiac Enzymes:  Basename 09/30/12 0625  CKTOTAL --  CKMB --  CKMBINDEX --  TROPONINI <0.30   CBC:  Lab 09/30/12 0625 09/29/12 1641 09/28/12 1110  WBC 11.7* 11.8* 26.5*  NEUTROABS 8.4* -- 24.1*  HGB 11.0* 11.7* 12.7*  HCT 32.5* 34.2* 37.2*  MCV 86.4 86.4 86.1  PLT 560* 510* 587*    PROTIME: No results found for this basename: LABPROT:3,INR:3 in the last 72 hours Liver Function Tests:  [facility] 09/30/12 0625 09/29/12 1556  AST 210* 151*  ALT 227* 151*  ALKPHOS 440* 418*  BILITOT 0.9 0.8  PROT 6.7 7.1  ALBUMIN 2.4* 2.5*      ASSESSMENT AND PLAN:  Patient Active Hospital Problem List: Acute pericarditis, unspecified (09/29/2012)   Elevated transaminase level/Chronic (09/17/2012)   Hypertension (09/18/2012)   Acute kidney failure, unspecified (09/22/2012)   Leukocytosis (09/29/2012)   Abdominal pain (09/29/2012)   Diarrhea (09/29/2012)     Very complicated problem with persistent pericarditis with GI sx likely 2/2 colchicine used 2/2 to renal toxicity concurrent with NSAIDs.  Now on Mobic. With stable renal function  I spoke with Dr. Erenest Blank at [facility] yesterday. 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 He suggested that we not pursue window at this time and that we  1 discontinue Mobic and begin aspirin 2 tablets 4 times a day with a target salicylate level of  10-20 2 continue colchicine 0.6 mg daily to see if he can tolerate it 3. Assess renal function closely 4 if he fails salicylate/colchicine therapy which we would anticipate being demonstrated by right heart failure 3-4 weeks from now, to undertake steroid therapy to try to prevent the development at that time of constrictive pericarditis.  I have reviewed this extensively with the patient. The patient has followup next week with Dr. DM. He would need a salicylate level and a metabolic profile in anticipation of that visit I expect the results were take a couple days to come back.    Signed, Sherryl Manges MD  10/01/2012  He will also need to be discharged on a PPI

## 2012-10-01 NOTE — Care Management Note (Signed)
    Page 1 of 1   10/01/2012     3:33:09 PM   CARE MANAGEMENT NOTE 10/01/2012  Patient:  Michael [name], Michael [name]   Account Number:  [id number]  Date Initiated:  10/01/2012  Documentation initiated by:  Yehoshua Vitelli  Subjective/Objective Assessment:   PT ADMITTED ON 09/28/12 FOLLOWING PERICARDITIS WITH COLCHICINE REACTION.  PTA, PT INDEPENDENT, LIVES WITH WIFE.     Action/Plan:   MET WITH PT TO DISCUSS DC PLANS.  WIFE TO PROVIDE CARE AT DISCHARGE.  PT DENIES ANY HOME NEEDS.   Anticipated DC Date:  10/02/2012   Anticipated DC Plan:  HOME/SELF CARE      DC Planning Services  CM consult      Choice offered to / List presented to:             Status of service:  Completed, signed off Medicare Important Message given?   (If response is "NO", the following Medicare IM given date fields will be blank) Date Medicare IM given:   Date Additional Medicare IM given:    Discharge Disposition:  HOME/SELF CARE  Per UR Regulation:  Reviewed for med. necessity/level of care/duration of stay  If discussed at Long Length of Stay Meetings, dates discussed:    Comments:

## 2012-10-01 NOTE — Progress Notes (Signed)
Patient to be discharged when wife arrives to pick him up. All discharge instructions given, explained using teach back, patient verbalized understanding. No Rx's were generated. Emphasized to obtain enteric coated [name] to avoid GI upset. Patient understands he may still use the colchicine from previous Rx, but will only take 1 tablet once a day. Will also use pain med from previous Rx. Wife now here. Patient ambulatory and walked out with wife to nearest entrance [facility]). IV and tele discontinued per protocol. Michael [name]

## 2012-10-01 NOTE — Discharge Summary (Signed)
Discharge Summary   Patient ID: Michael [name],  MRN: [id number], DOB/AGE: [birth year] 31 y.o.  Admit date: 09/28/2012 Discharge date: 10/01/2012  Primary Physician: Sheila Oats, MD Primary Cardiologist: Berton Mount, MD; follow-up with Dr. Marca Ancona  Discharge Diagnoses Principal Problem:  *Acute pericarditis, unspecified Active Problems:  Elevated transaminase level/Chronic  Hypertension  Leukocytosis   Allergies No Known Allergies  Diagnostic Studies/Procedures  CARDIAC CATHETERIZATION - 09/28/12  HEMODYNAMICS:  AO SYSTOLIC/AO DIASTOLIC: 110/82  LV SYSTOLIC/LV DIASTOLIC: 110/12  ANGIOGRAPHIC RESULTS:  1. Left main: nl  2. LAD: nl  3. Ramus: nl  4. Left circumflex: nl  5. Right coronary artery: nl  6. Left ventriculography; RAO left ventriculogram was performed using  25 mL of Omnipaque dye at 12 mL/second. The overall LVEF estimated  55 %. There was normal wall motion.  IMPRESSION: Normal LV function  Normal coronary arteries  2D ECHO - 09/29/12  Study Conclusions  - Left ventricle: The cavity size was normal. Wall thickness was increased in a pattern of mild LVH. Systolic function was normal. The estimated ejection fraction was in the range of 60% to 65%. - Pericardium, extracardiac: A moderate, free-flowing pericardial effusion was identified. The fluid had no internal echoes.There was no evidence of hemodynamic compromise. Features were not consistent with tamponade physiology. There was a left pleural effusion.  PA/LATERAL CHEST X-RAY - 09/29/12  CHEST - 2 VIEW  Comparison: 09/21/2012  Findings: Stable cardiac enlargement. Edema pattern has improved  since the prior study with some increased prominence of pulmonary  vascularity remaining. There also is a small left pleural  effusion.  IMPRESSION:  Stable cardiomegaly and decrease in pulmonary edema. Small left  pleural effusion present.  PA/LATERAL CHEST X-RAY - 10/01/12  CHEST -  2 VIEW  Comparison: 09/29/2012  Findings: No frank interstitial edema. Small to moderate left  pleural effusion, unchanged. Associated left lower lobe opacity,  possibly atelectasis. No pneumothorax.  The heart is top normal in size.  Visualized osseous structures are within normal limits.  IMPRESSION:  Small to moderate left pleural effusion, unchanged.  Associated left lower lobe opacity, possibly atelectasis.  History of Present Illness  Michael [name] is a 31yo AA male who was admitted to [facility] on 09/28/12 with the above problem list. He had recently been discharged for acute pericarditis in the setting of a recent viral illness. He had a fairly complicated course. He originally followed-up with Dr. Graciela Husbands earlier this month for chest pain and ST elevations on EKG c/w pericarditis. He was started on NSAIDs. He presented back to the ED < 24 hrs later in severe pain with increased respiratory effort and fevers. He was admitted for pain control and ongoing fever. He developed ARF on NSAIDs. He has chronic transaminitis. GI consult and work-up revealed no acute pathology. O2 sat's dropped into the 70s requiring NRB. CXR revealed a pericardial effusion, and echo revealed early tamponade features requiring gentle, transient diuresis. ID was asked to evaluate him. There was no obvious infectious etiology to the pericarditis/effusion. He also had pleural effusions without infection. It was felt the fevers were likely secondary to the pericarditis, no bacterial source identified. RMSF, B. Burgdorfi, HIV, CMV, mycoplasma and parvovirus were all negative. Echovirus <1:10. EBV and CMV IgG were positive. Blood cultures were negative as well. ANA negative. He did continue to have intermittent fevers, but symptoms improved, and he was discharged on colchicine and narcotic for pain control.   Approximately 1 week after, he developed sharp abdominal  pain and profuse diarrhea. He became dehydrated as a  result and presented to urgent care. There, he told them of his recent discharge for pericarditis, and an ECG revealed ST elevations concerning for STEMI. He was transported to [facility] cath lab for emergent heart cath. The full results are outlined above, and were notable for patent coronaries and preserved LVEF at 55%.   Hospital Course   ST elevations were attributed to persistent pericarditis. His abdominal pain and diarrhea was attributed to intolerance to colchicine. Colchicine was discontinued, and he was observed overnight. Mobic was started with close renal function. CBC revealed a significant leukocytosis with bandemia. Given his recent complicated admission, a repeat 2D echo was ordered revealing LVEF 60-65% and initially qualified free-flowing left pericardial effusion with low echogenicity. This was reviewed further by Dr. Shirlee Latch and foun to be fairly organized and representing early constrictive features. ID was consulted once more. Ur/bl cx x 2 negative. Percardiocentesis/pericardial window was considered. A repeat CBC revealed considerable reduction in leukocytosis and no bandemia. The original result was felt to represent lab error. He continued to be intermittently febrile, but clinically non-toxic appearing. TCTS was consulted regarding this in an effort to diagnose the etiology of the patient's pericarditis. The initial plan was made to perform a pericardial window.   Dr. Graciela Husbands consulted the expertise of Dr. Erenest Blank at the [facility] regarding the situation. The recommendation was to not purse the pericardial window and instead, replace Mobic with ASA QID to a target salicylate level of 10-20. Continue colchine daily and follow renal function. He was assessed by Dr. Graciela Husbands today and was felt to be stable for discharge. He will follow-up in the office next week with Dr. Shirlee Latch as noted below. Labs to assess renal function and salicylate level will be drawn later this week. Serial CXRs  revealed improving pleural effusions. This information, including post-cath instructions, have been clearly outlined in the discharge AVS.   Discharge Vitals:  Blood pressure 119/68, pulse 100, temperature 99 F (37.2 C), temperature source Oral, resp. rate 18, height 5\' 10"  (1.778 m), weight 104.962 kg (231 lb 6.4 oz), SpO2 98.00%.   Labs: Recent Labs  [facility] 09/30/12 0625 09/29/12 1641   WBC 11.7* 11.8*   HGB 11.0* 11.7*   HCT 32.5* 34.2*   MCV 86.4 86.4   PLT 560* 510*    Lab 09/30/12 0625 09/29/12 1641 09/28/12 1110  NA 138 135 132*  K 3.5 3.5 4.4  CL 100 99 93*  CO2 23 26 25   BUN 14 18 24*  CREATININE 1.16 1.24 1.57*  CALCIUM 8.9 9.1 10.0  PROT 6.7 -- --  BILITOT 0.9 -- --  ALKPHOS 440* -- --  ALT 227* -- --  AST 210* -- --  AMYLASE -- -- --  LIPASE -- -- --  GLUCOSE 90 102* 156*   Recent Labs  Basename 09/30/12 0625   CKTOTAL --   CKMB --   CKMBINDEX --   TROPONINI <0.30   Disposition:  Discharge Orders    Future Appointments: Provider: Department: Dept Phone: Center:   10/03/2012 10:25 AM [facility] [phone] [facility]   10/08/2012 11:15 AM Laurey Morale, MD [facility] [phone] [facility]   10/27/2012 8:30 AM [facility] [phone] None   10/28/2012 2:30 PM Rachael Fee, MD [facility] [phone] [facility]     Follow-up Information    Follow up with Marca Ancona, MD. On 10/08/2012. (At 11:15 AM  for follow-up after this hospitalization. )    Contact information:   [address] [place] [address] [phone]       Follow up with [facility]. On 10/03/2012. (For labwork. Please arrive between 8:30 AM and 5:00 PM. )    Contact information:   [address] [place] [address]          Discharge Medications:    Medication List     As of 10/01/2012  3:02 PM    START taking these medications          aspirin EC 325 MG tablet   Take 2 tablets (650 mg total) by mouth every 6 (six) hours.      CHANGE how you take these medications         colchicine 0.6 MG tablet   Take 1 tablet (0.6 mg total) by mouth daily.   What changed: how often to take the med      CONTINUE taking these medications         oxyCODONE-acetaminophen 5-325 MG per tablet   Commonly known as: PERCOCET/ROXICET          Where to get your medications       Information on where to get these meds is not yet available. Ask your nurse or doctor.         aspirin EC 325 MG tablet   colchicine 0.6 MG tablet            Outstanding Labs/Studies: BMET and salicylate level 11/1  Duration of Discharge Encounter: Greater than 30 minutes including physician time.  Signed, R. Hurman Horn, PA-C 10/01/2012, 3:02 PM

## 2012-10-02 ENCOUNTER — Encounter: Payer: Self-pay | Admitting: *Deleted

## 2012-10-02 LAB — QUANTIFERON TB GOLD ASSAY (BLOOD)

## 2012-10-02 SURGERY — CREATION, PERICARDIAL WINDOW, SUBXIPHOID APPROACH
Anesthesia: General

## 2012-10-03 ENCOUNTER — Other Ambulatory Visit (INDEPENDENT_AMBULATORY_CARE_PROVIDER_SITE_OTHER): Payer: BC Managed Care – PPO

## 2012-10-03 ENCOUNTER — Other Ambulatory Visit: Payer: BC Managed Care – PPO

## 2012-10-03 DIAGNOSIS — R7401 Elevation of levels of liver transaminase levels: Secondary | ICD-10-CM

## 2012-10-03 DIAGNOSIS — I517 Cardiomegaly: Secondary | ICD-10-CM

## 2012-10-03 DIAGNOSIS — I10 Essential (primary) hypertension: Secondary | ICD-10-CM

## 2012-10-03 DIAGNOSIS — I309 Acute pericarditis, unspecified: Secondary | ICD-10-CM

## 2012-10-03 DIAGNOSIS — D72829 Elevated white blood cell count, unspecified: Secondary | ICD-10-CM

## 2012-10-03 LAB — BASIC METABOLIC PANEL
BUN: 12 mg/dL (ref 6–23)
Calcium: 9.2 mg/dL (ref 8.4–10.5)
Chloride: 102 mEq/L (ref 96–112)
Creatinine, Ser: 1.3 mg/dL (ref 0.4–1.5)

## 2012-10-04 LAB — SALICYLATE LEVEL: Salicylate Lvl: 2.3 mg/dL (ref 2.8–20.0)

## 2012-10-05 LAB — CULTURE, BLOOD (ROUTINE X 2): Culture: NO GROWTH

## 2012-10-06 ENCOUNTER — Other Ambulatory Visit: Payer: BC Managed Care – PPO

## 2012-10-08 ENCOUNTER — Encounter: Payer: Self-pay | Admitting: Cardiology

## 2012-10-08 ENCOUNTER — Ambulatory Visit (INDEPENDENT_AMBULATORY_CARE_PROVIDER_SITE_OTHER): Payer: BC Managed Care – PPO | Admitting: Cardiology

## 2012-10-08 VITALS — BP 132/76 | HR 80 | Ht 70.0 in | Wt 226.0 lb

## 2012-10-08 DIAGNOSIS — I309 Acute pericarditis, unspecified: Secondary | ICD-10-CM

## 2012-10-08 NOTE — Patient Instructions (Addendum)
Take aspirin 3 times a day for 1 week, then decrease to 2  times a day for 2 weeks, then stop taking aspirin.   Your physician recommends that you return for lab work in: 1 week--BMET/Liver profile   Your physician recommends that you schedule a follow-up appointment in: 1 month with Dr Shirlee Latch.   Your physician has requested that you have an echocardiogram. Echocardiography is a painless test that uses sound waves to create images of your heart. It provides your doctor with information about the size and shape of your heart and how well your heart's chambers and valves are working. This procedure takes approximately one hour. There are no restrictions for this procedure. In 2 months

## 2012-10-08 NOTE — Progress Notes (Signed)
Patient ID: Michael [name], male   DOB: 09/30/1981, 31 y.o.   MRN: [id number] 31 yo with complicated recent history culminating in effusive constrictive pericarditis presents for cardiology followup.  Patient was initially admitted on 10/17 with pleuritic chest pain, fevers, and an ECG classic for acute pericarditis.  Initial echo showed no pericardial effusion . He was started on NSAIDs.  In the hospital, he remained persistently febrile.  He developed ARF and NSAIDs were stopped.  He was put on colchicine.  He also had an acute rise in his LFTs with unremarkable abdominal ultrasound.  ID work up was unremarkable.  While in the hospital, he received Lovenox and likely bled into his pericardium.  Repeat echoes initially showed a moderate free-flowing pericardial effusion progressing to an organized effusion with evidence for ventricular interdependence.  He was discharged on colchicine on 10/24.  He was re-admitted on 10/27 because of epigastric pain and nausea, diarrhea.  ECG still showed diffuse ST elevation of acute pericarditis.  He actually had a left heart cath that showed no coronary disease.  Symptoms resolved after stopping bid colchicine.  He remained febrile and WBCs had increased up to 26,000 this admission.  He was re-evaluated by ID with the thought that fever and persistent WBCs may be due to hemopericardium.  He was restarted on NSAIDs - this time using ASA.  Colchicine was also restarted but at one a day rather than bid.   Since discharge from the hospital, he has been doing well.  No pleuritic chest pain.  He will sometimes note some mild discomfort when he lies down.  He has started jogging again.  He says that he feels more short of breath than prior to admission with jogging.  No dyspnea when walking.  No further epigastric pain or diarrhea.   Labs (10/13): HIV, RMSF, Lyme, CMV, echovirus, parvovirus, HCV, HBV, and mycoplasma serologies were negative.  Ceruloplasmin level was normal.  ANA  negative.  ASO titer not elevated.   Labs (11/13): K 3.7, creatinine 1.2  PMH: 1. 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  Admitted 10/13 with symptoms/signs consistent with acute pericarditis.  Initial echo on 10/16 showed EF 65-70%, no pericardial effusion.  He received Lovenox in the hospital and echo on 10/19 showed EF 65-70% with a moderate free-flowing pericardial effusion.  Echo 10/21 showed a small organizing pericardial effusion with evidence for ventricular interdependence.  He was initially started on NSAIDs but developed ARF so they were stopped.  He was sent home on colchicine but returned after a couple of days with epigastric pain and diarrhea.  He had a cath given ST elevation on ECG (from pericarditis).  This showed no significant disease.  Echo 10/28 showed normal EF with ventricular interdependence again.  2. HTN 3. OSA 4. Chronic LFT elevation: ANA negative, hepatitis serologies negative, not a heavy drinker.  5. LHC (10/13): no angiographic CAD.   SH: Nonsmoker, rare ETOH.   FH: No premature CAD  ROS: All systems reviewed and negative except as per HPI.   Current Outpatient Prescriptions  Medication Sig Dispense Refill  . aspirin EC 325 MG tablet Take 2 tablets (650 mg total) by mouth every 6 (six) hours.  30 tablet  0  . colchicine 0.6 MG tablet Take 1 tablet (0.6 mg total) by mouth daily.      Marland Kitchen oxyCODONE-acetaminophen (PERCOCET/ROXICET) 5-325 MG per tablet Take 1 tablet by mouth every 8 (eight) hours as needed. For pain  BP 132/76  Pulse 80  Ht 5\' 10"  (1.778 m)  Wt 226 lb (102.513 kg)  BMI 32.43 kg/m2 General: NAD Neck: No JVD, no thyromegaly or thyroid nodule.  Lungs: Clear to auscultation bilaterally with normal respiratory effort. CV: Nondisplaced PMI.  Heart regular S1/S2, no S3/S4, no murmur.  No peripheral edema.  No carotid bruit.  Normal pedal pulses.  Abdomen: Soft, nontender, no hepatosplenomegaly, no distention.    Neurologic: Alert and oriented x 3.  Psych: Normal affect. Extremities: No clubbing or cyanosis.    Assessment/Plan: 1. Pericardial disease: Patient's course began as acute pericarditis, likely viral.  He received Lovenox and bled into his pericardium.  He then developed a pericardial effusion that later organized.  Echoes during the latter part of hospital admission showed an effusive/constrictive picture with organization of the pericardial effusion and evidence for ventricular interdependence.   - Continue anti-inflammatory treatment.  ASA 650 mg q8 hrs x 1 more week, then ASA 650 bid x 2 wks, then stop.  Colchicine 0.6 mg daily x 3 months total.   - Repeat echo in 2 months to assess for effusive/constrictive or constrictive pericarditis.  2. Renal: ARF on Ibuprofen at initial admission.  So far he has tolerated ASA without problems.  I will get a BMET next week.  3. Transaminitis: Patient has a chronic history of elevated LFTs.  This worsened during initial hospitalization.  ANA and hepatitis serologies were negative.  I will get repeat LFTs next week.   4. ID: ID workup did not suggest a bacterial cause for his pericarditis.  Likely he had a viral pericarditis complicated by hemopericardium in the setting of Lovenox use.   Marca Ancona 10/08/2012

## 2012-10-10 ENCOUNTER — Ambulatory Visit: Payer: BC Managed Care – PPO | Admitting: Internal Medicine

## 2012-10-15 ENCOUNTER — Other Ambulatory Visit (INDEPENDENT_AMBULATORY_CARE_PROVIDER_SITE_OTHER): Payer: BC Managed Care – PPO

## 2012-10-15 DIAGNOSIS — I309 Acute pericarditis, unspecified: Secondary | ICD-10-CM

## 2012-10-15 LAB — HEPATIC FUNCTION PANEL
Albumin: 3.5 g/dL (ref 3.5–5.2)
Bilirubin, Direct: 0.2 mg/dL (ref 0.0–0.3)
Total Protein: 7.6 g/dL (ref 6.0–8.3)

## 2012-10-15 LAB — BASIC METABOLIC PANEL
CO2: 28 mEq/L (ref 19–32)
Calcium: 9.5 mg/dL (ref 8.4–10.5)
Creatinine, Ser: 1.2 mg/dL (ref 0.4–1.5)
GFR: 87.64 mL/min (ref >60.00)
Sodium: 140 mEq/L (ref 135–145)

## 2012-10-27 ENCOUNTER — Other Ambulatory Visit: Payer: BC Managed Care – PPO

## 2012-10-28 ENCOUNTER — Telehealth: Payer: Self-pay | Admitting: Cardiology

## 2012-10-28 ENCOUNTER — Ambulatory Visit: Payer: BC Managed Care – PPO | Admitting: Gastroenterology

## 2012-10-28 NOTE — Telephone Encounter (Signed)
Pt is having some discomfort when he takes a deep breath and he wants to be seen tomorrow. He is not feeling to well so he is going to lay down but it is on to talk to his wife.

## 2012-10-28 NOTE — Telephone Encounter (Signed)
Per Dr. Shirlee Latch pt. is advised to take 650 mg of ASA every 8 hours for pain, appt. with Norma Fredrickson, NP at 2 pm tomorrow and Echo rescheduled from 1-7 to tomorrow at 4 pm. Pt. verbalized understanding.

## 2012-10-29 ENCOUNTER — Other Ambulatory Visit (HOSPITAL_COMMUNITY): Payer: BC Managed Care – PPO

## 2012-10-29 ENCOUNTER — Ambulatory Visit (HOSPITAL_COMMUNITY): Payer: BC Managed Care – PPO | Attending: Cardiology | Admitting: Radiology

## 2012-10-29 ENCOUNTER — Encounter: Payer: Self-pay | Admitting: Nurse Practitioner

## 2012-10-29 ENCOUNTER — Ambulatory Visit (INDEPENDENT_AMBULATORY_CARE_PROVIDER_SITE_OTHER): Payer: BC Managed Care – PPO | Admitting: Nurse Practitioner

## 2012-10-29 VITALS — BP 110/64 | HR 87 | Ht 70.0 in | Wt 222.8 lb

## 2012-10-29 DIAGNOSIS — I309 Acute pericarditis, unspecified: Secondary | ICD-10-CM | POA: Insufficient documentation

## 2012-10-29 DIAGNOSIS — I369 Nonrheumatic tricuspid valve disorder, unspecified: Secondary | ICD-10-CM | POA: Insufficient documentation

## 2012-10-29 DIAGNOSIS — I319 Disease of pericardium, unspecified: Secondary | ICD-10-CM | POA: Insufficient documentation

## 2012-10-29 DIAGNOSIS — G4733 Obstructive sleep apnea (adult) (pediatric): Secondary | ICD-10-CM | POA: Insufficient documentation

## 2012-10-29 DIAGNOSIS — E039 Hypothyroidism, unspecified: Secondary | ICD-10-CM

## 2012-10-29 DIAGNOSIS — I059 Rheumatic mitral valve disease, unspecified: Secondary | ICD-10-CM | POA: Insufficient documentation

## 2012-10-29 DIAGNOSIS — I10 Essential (primary) hypertension: Secondary | ICD-10-CM | POA: Insufficient documentation

## 2012-10-29 LAB — BASIC METABOLIC PANEL
BUN: 8 mg/dL (ref 6–23)
CO2: 25 mEq/L (ref 19–32)
Calcium: 9.2 mg/dL (ref 8.4–10.5)
Chloride: 105 mEq/L (ref 96–112)
Creat: 1 mg/dL (ref 0.50–1.35)
Glucose, Bld: 116 mg/dL — ABNORMAL HIGH (ref 70–99)
Potassium: 3.4 mEq/L — ABNORMAL LOW (ref 3.5–5.3)
Sodium: 141 mEq/L (ref 135–145)

## 2012-10-29 LAB — CBC WITH DIFFERENTIAL/PLATELET
Basophils Absolute: 0 10*3/uL (ref 0.0–0.1)
Basophils Relative: 0 % (ref 0–1)
Eosinophils Absolute: 0.1 10*3/uL (ref 0.0–0.7)
Eosinophils Relative: 1 % (ref 0–5)
HCT: 35.5 % — ABNORMAL LOW (ref 39.0–52.0)
Hemoglobin: 12.3 g/dL — ABNORMAL LOW (ref 13.0–17.0)
Lymphocytes Relative: 26 % (ref 12–46)
Lymphs Abs: 2.3 10*3/uL (ref 0.7–4.0)
MCH: 28.4 pg (ref 26.0–34.0)
MCHC: 34.6 g/dL (ref 30.0–36.0)
MCV: 82 fL (ref 78.0–100.0)
Monocytes Absolute: 0.8 10*3/uL (ref 0.1–1.0)
Monocytes Relative: 9 % (ref 3–12)
Neutro Abs: 5.6 10*3/uL (ref 1.7–7.7)
Neutrophils Relative %: 64 % (ref 43–77)
Platelets: 187 10*3/uL (ref 150–400)
RBC: 4.33 MIL/uL (ref 4.22–5.81)
RDW: 13.8 % (ref 11.5–15.5)
WBC: 8.9 10*3/uL (ref 4.0–10.5)

## 2012-10-29 LAB — TSH: TSH: 0.698 u[IU]/mL (ref 0.350–4.500)

## 2012-10-29 NOTE — Progress Notes (Signed)
Michael [name] Date of Birth: [birth year] Medical Record #[id number]  History of Present Illness: Michael [name] is seen today for a work in visit. He is seen for Michael [name]. He has a complicated history which has culminated into an effusive constrictive pericarditis. He was admitted October 17 with pleuritic chest pain, fever and ECG classic for acute pericarditis. Initial echo showed no pericardial effusion. Started on NSAIDs. Remained febrile, then developed ARF and his NSAIDs were stopped. Put on colchicine. Had an acute rise in his LFT's with an unremarkable abdominal ultrasound. ID work up was unremarkable. While hospitalized he did get Lovenox and probably bled into the pericardium. Repeaet echoes showed a moderate free flowing pericardial effusion progressing to an organized effusion with evidence for ventricular interdependence. Discharged on the 24th of October, then readmitted on 10/27 with epigastric pain, nausea and diarrhea. ECG still showed diffuse ST elevation of acute pericarditis. He has had a heart cath showing no coronary disease. Had to stop the colchicine and his symptoms resolved. Remained febrile and it was ID's opinion that his persistent leukocytosis was due to hemopericardium. Put back on aspirin and daily colchicine. He had actually been scheduled for a pericardial window with Michael [name] but this was cancelled after Michael [name] spoke with Michael [name] at [facility] yesterday. 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 He suggested that we not pursue window at this time and maintain aspirin and colchicine therapy.   He was seen here earlier this month. Was back jogging. Seemed to be doing ok.   Called yesterday with complaints of pleuritic chest pain. Michael [name] advised Q8hr aspirin, and BID colchicine  and a repeat echo which will be done later this afternoon.   He comes in today. He comes from work. He is here alone. Notes that for the past couple of days he has noted more upper left sided chest pain, especially if he lies back. Hurts to take a deep breath. No fever. No chills. Feels better if he is upright. He is taking the aspirin q8h but will only do the colchicine once a day due to his prior unpleasant GI experience.   Current Outpatient Prescriptions on File Prior to Visit  Medication Sig Dispense Refill  . colchicine 0.6 MG tablet Take 1 tablet (0.6 mg total) by mouth daily.      . [DISCONTINUED] aspirin EC 325 MG tablet Take 2 tablets (650 mg total) by mouth every 6 (six) hours.  30 tablet  0    No Known Allergies  PMH: 1. 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 Admitted 10/13 with symptoms/signs consistent with acute pericarditis. Initial echo on 10/16 showed EF 65-70%, no pericardial effusion. He received Lovenox in the hospital and echo on 10/19 showed EF 65-70% with a moderate free-flowing pericardial effusion. Echo 10/21 showed a small organizing pericardial effusion with evidence for ventricular interdependence. He was initially started on NSAIDs but developed ARF so they were stopped. He was sent home on colchicine but returned after a couple of days with epigastric pain and diarrhea. He had a cath given ST elevation on ECG (from pericarditis). This showed no significant disease. Echo 10/28 showed normal EF with ventricular interdependence again.   2. HTN   3. OSA  4. Chronic LFT elevation: ANA negative, hepatitis serologies negative, not a heavy drinker.   5. LHC (10/13): no  angiographic CAD.    Past Surgical History  Procedure Date  . Cardiac catheterization     left heart cath    History  Smoking status  . Never Smoker   Smokeless tobacco  . Former Neurosurgeon  . Types: Snuff  . Quit date: 09/02/2012    History  Alcohol Use  . Yes     History reviewed. No pertinent family history.  Review of Systems: The review of systems is per the HPI.  All other systems were reviewed and are negative.  Physical Exam: BP 110/64  Pulse 87  Ht 5\' 10"  (1.778 m)  Wt 222 lb 12.8 oz (101.061 kg)  BMI 31.97 kg/m2 Weight is down 4 pounds. Patient is pleasant and in no acute distress. He is obese. Skin is warm and dry. Color is normal.  HEENT is unremarkable. Normocephalic/atraumatic. PERRL. Sclera are nonicteric. Neck is supple. No masses. No JVD. Lungs are clear. Cardiac exam shows a regular rate and rhythm. I cannot appreciate a rub. Abdomen is soft. Extremities are without edema. Gait and ROM are intact. No gross neurologic deficits noted.  LABORATORY DATA: EKG today shows sinus with nonspecific ST and T wave changes. The tracing actually looks better. Still has long QT which is unchanged.   Lab Results  Component Value Date   WBC 11.7* 09/30/2012   HGB 11.0* 09/30/2012   HCT 32.5* 09/30/2012   PLT 560* 09/30/2012   GLUCOSE 109* 10/15/2012   ALT 45 10/15/2012   AST 27 10/15/2012   NA 140 10/15/2012   K 3.7 10/15/2012   CL 103 10/15/2012   CREATININE 1.2 10/15/2012   BUN 13 10/15/2012   CO2 28 10/15/2012   TSH 9.958* 09/20/2012   INR 1.41 09/28/2012   Echo Study Conclusions from September 29, 2012  - Left ventricle: The cavity size was normal. Wall thickness was increased in a pattern of mild LVH. Systolic function was normal. The estimated ejection fraction was in the range of 60% to 65%. - Pericardium, extracardiac: A moderate, free-flowing pericardial effusion was identified. The fluid had no internal echoes.There was no evidence of hemodynamic compromise. Features were not consistent with tamponade physiology. There was a left pleural effusion.   Assessment / Plan: 1. Pleuritic chest pain - with known pericarditis/effusion - will need to repeat his echo. May need steroid taper and/or referral back to [facility] for  pericardial window. He is not willing to take the Colchicine BID. I have left him on the aspirin therapy Q8h. We will recheck CBC and BMET today.  2. ?hypothyroid - TSH grossly elevated in hospital. Will recheck today as well.   We will need to get the echo to see what we are dealing with. Further disposition to follow.   Patient is agreeable to this plan and will call if any problems develop in the interim.

## 2012-10-29 NOTE — Patient Instructions (Addendum)
We need to check labs today  You need to stay on your current medicines  See Dr. Shirlee Latch next week as planned  We will get your echo today.   Call the [facility] [place] office at [phone] if you have any questions, problems or concerns.

## 2012-10-29 NOTE — Progress Notes (Signed)
Echocardiogram performed.  

## 2012-10-31 ENCOUNTER — Telehealth: Payer: Self-pay | Admitting: Cardiology

## 2012-10-31 NOTE — Telephone Encounter (Signed)
Pt c/o chest pain.  Advised to go to er.

## 2012-10-31 NOTE — Telephone Encounter (Signed)
Pt has peri carditis and is still having pain and it is getting worse and he wants to know what to do

## 2012-11-05 ENCOUNTER — Encounter: Payer: Self-pay | Admitting: Cardiology

## 2012-11-05 ENCOUNTER — Ambulatory Visit (INDEPENDENT_AMBULATORY_CARE_PROVIDER_SITE_OTHER): Payer: BC Managed Care – PPO | Admitting: Cardiology

## 2012-11-05 VITALS — BP 121/76 | HR 91 | Ht 71.0 in | Wt 230.0 lb

## 2012-11-05 DIAGNOSIS — I309 Acute pericarditis, unspecified: Secondary | ICD-10-CM

## 2012-11-05 MED ORDER — COLCHICINE 0.6 MG PO TABS: 0.6000 mg | ORAL_TABLET | Freq: Every day | ORAL | Status: DC

## 2012-11-05 NOTE — Patient Instructions (Addendum)
**Note De-identified Sarath Privott Obfuscation** Your physician recommends that you continue on your current medications as directed. Please refer to the Current Medication list given to you today.  Your physician recommends that you schedule a follow-up appointment in: 3 months  

## 2012-11-05 NOTE — Progress Notes (Signed)
Patient ID: Michael [name], male   DOB: 1981/03/06, 31 y.o.   MRN: [id number] 31 yo with complicated recent history culminating in effusive constrictive pericarditis presents for cardiology followup.  Patient was initially admitted on 10/17 with pleuritic chest pain, fevers, and an ECG classic for acute pericarditis.  Initial echo showed no pericardial effusion . He was started on NSAIDs.  In the hospital, he remained persistently febrile.  He developed ARF and NSAIDs were stopped.  He was put on colchicine.  He also had an acute rise in his LFTs with unremarkable abdominal ultrasound.  ID work up was unremarkable.  While in the hospital, he received Lovenox and likely bled into his pericardium.  Repeat echoes initially showed a moderate free-flowing pericardial effusion progressing to an organized effusion with evidence for ventricular interdependence.  He was discharged on colchicine on 10/24.  He was re-admitted on 10/27 because of epigastric pain and nausea, diarrhea.  ECG still showed diffuse ST elevation of acute pericarditis.  He actually had a left heart cath that showed no coronary disease.  Symptoms resolved after stopping bid colchicine.  He remained febrile and WBCs had increased up to 26,000 this admission.  He was re-evaluated by ID with the thought that fever and persistent WBCs may be due to hemopericardium.  He was restarted on NSAIDs - this time using ASA.  Colchicine was also restarted but at one a day rather than bid.   Since discharge from the hospital, he had one episode of recurrent pleuritic chest pain a week or so ago.  This resolved after going back on ASA 650 mg every 8 hours.  Today, he is doing well.  No chest pain.  Doing well.  No exertional dyspnea.  Has been jogging with no problems.  Echo was done after his last appointment with Norma Fredrickson.  Per report, this showed EF 60-65% with a small pericardial effusion.  No comment on constrictive physiology.  I am unable to view images as  [employer] is down currently.   Labs (10/13): HIV, RMSF, Lyme, CMV, echovirus, parvovirus, HCV, HBV, and mycoplasma serologies were negative.  Ceruloplasmin level was normal.  ANA negative.  ASO titer not elevated.   Labs (11/13): K 3.7, creatinine 1.2 => 1.0, HCT 35.5  PMH: 1. 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  Admitted 10/13 with symptoms/signs consistent with acute pericarditis.  Initial echo on 10/16 showed EF 65-70%, no pericardial effusion.  He received Lovenox in the hospital and echo on 10/19 showed EF 65-70% with a moderate free-flowing pericardial effusion.  Echo 10/21 showed a small organizing pericardial effusion with evidence for ventricular interdependence.  He was initially started on NSAIDs but developed ARF so they were stopped.  He was sent home on colchicine but returned after a couple of days with epigastric pain and diarrhea.  He had a cath given ST elevation on ECG (from pericarditis).  This showed no significant disease.  Echo 10/28 showed normal EF with ventricular interdependence again.  2. HTN 3. OSA 4. Chronic LFT elevation: ANA negative, hepatitis serologies negative, not a heavy drinker.  5. LHC (10/13): no angiographic CAD.   SH: Nonsmoker, rare ETOH.   FH: No premature CAD  Current Outpatient Prescriptions  Medication Sig Dispense Refill  . aspirin EC 325 MG tablet Take 650 mg by mouth every 8 (eight) hours.      . colchicine 0.6 MG tablet Take 1 tablet (0.6 mg total) by mouth daily.  BP 121/76  Pulse 91  Ht 5\' 11"  (1.803 m)  Wt 230 lb (104.327 kg)  BMI 32.08 kg/m2 General: NAD Neck: No JVD, no thyromegaly or thyroid nodule.  Lungs: Clear to auscultation bilaterally with normal respiratory effort. CV: Nondisplaced PMI.  Heart regular S1/S2, no S3/S4, no murmur, no rub.  No peripheral edema.  No carotid bruit.  Normal pedal pulses.  Abdomen: Soft, nontender, no hepatosplenomegaly, no distention.  Neurologic: Alert  and oriented x 3.  Psych: Normal affect. Extremities: No clubbing or cyanosis.    Assessment/Plan: 1. Pericardial disease: Patient's course began as acute pericarditis, likely viral.  He received Lovenox and bled into his pericardium.  He then developed a pericardial effusion that later organized.  Echoes during the latter part of hospital admission showed an effusive/constrictive picture with organization of the pericardial effusion and evidence for ventricular interdependence.  He had recurrent pleuritic pain about a week ago and went back on ASA 650 q8 hrs.  Pain has totally resolved.  Echo last week showed EF 60-65% and small pericardial effusion.  I have been unable to personally review images yet as [employer] has been down in the office.  - Continue anti-inflammatory treatment. Continue ASA 650 q8 hrs for total of 2 wks and will continue colchicine for total of 3 months.  - Repeat echo after followup in 3 months to assess for effusive/constrictive or constrictive pericarditis.  2. Renal: ARF on Ibuprofen at initial admission.  So far he has tolerated ASA without problems. Most recent BMET showed normal creatinine.  3. Transaminitis: Patient has a chronic history of elevated LFTs.  This worsened during initial hospitalization.  ANA and hepatitis serologies were negative.    4. ID: ID workup did not suggest a bacterial cause for his pericarditis.  Likely he had a viral pericarditis complicated by hemopericardium in the setting of Lovenox use.   Marca Ancona 11/05/2012

## 2012-12-09 ENCOUNTER — Other Ambulatory Visit (HOSPITAL_COMMUNITY): Payer: BC Managed Care – PPO

## 2012-12-25 ENCOUNTER — Telehealth: Payer: Self-pay | Admitting: Gastroenterology

## 2012-12-25 NOTE — Telephone Encounter (Signed)
Message copied by Arna Snipe on Thu Dec 25, 2012  4:26 PM ------      Message from: Donata Duff      Created: Tue Oct 28, 2012  3:01 PM       Do not bill

## 2013-02-03 ENCOUNTER — Ambulatory Visit: Payer: BC Managed Care – PPO | Admitting: Cardiology

## 2013-02-23 ENCOUNTER — Ambulatory Visit: Payer: BC Managed Care – PPO | Admitting: Cardiology

## 2013-02-27 ENCOUNTER — Encounter: Payer: Self-pay | Admitting: Cardiology

## 2013-07-20 ENCOUNTER — Encounter: Payer: Self-pay | Admitting: Nurse Practitioner

## 2013-07-20 ENCOUNTER — Ambulatory Visit (INDEPENDENT_AMBULATORY_CARE_PROVIDER_SITE_OTHER): Payer: BC Managed Care – PPO | Admitting: Nurse Practitioner

## 2013-07-20 VITALS — BP 120/70 | HR 96 | Ht 70.0 in | Wt 243.8 lb

## 2013-07-20 DIAGNOSIS — R079 Chest pain, unspecified: Secondary | ICD-10-CM

## 2013-07-20 LAB — BASIC METABOLIC PANEL
BUN: 16 mg/dL (ref 6–23)
CO2: 25 mEq/L (ref 19–32)
Calcium: 9.6 mg/dL (ref 8.4–10.5)
Chloride: 105 mEq/L (ref 96–112)
Creatinine, Ser: 1.2 mg/dL (ref 0.4–1.5)
GFR: 92.4 mL/min (ref >60.00)
Glucose, Bld: 91 mg/dL (ref 70–99)
Potassium: 3.2 mEq/L — ABNORMAL LOW (ref 3.5–5.1)
Sodium: 139 mEq/L (ref 135–145)

## 2013-07-20 LAB — CBC WITH DIFFERENTIAL/PLATELET
Basophils Absolute: 0 10*3/uL (ref 0.0–0.1)
Basophils Relative: 0.6 % (ref 0.0–3.0)
Eosinophils Absolute: 0.2 10*3/uL (ref 0.0–0.7)
Eosinophils Relative: 4.2 % (ref 0.0–5.0)
HCT: 42 % (ref 39.0–52.0)
Hemoglobin: 14.4 g/dL (ref 13.0–17.0)
Lymphocytes Relative: 36.5 % (ref 12.0–46.0)
Lymphs Abs: 2.2 10*3/uL (ref 0.7–4.0)
MCHC: 34.4 g/dL (ref 30.0–36.0)
MCV: 86.9 fl (ref 78.0–100.0)
Monocytes Absolute: 0.3 10*3/uL (ref 0.1–1.0)
Monocytes Relative: 5.1 % (ref 3.0–12.0)
Neutro Abs: 3.2 10*3/uL (ref 1.4–7.7)
Neutrophils Relative %: 53.6 % (ref 43.0–77.0)
Platelets: 174 10*3/uL (ref 150.0–400.0)
RBC: 4.83 Mil/uL (ref 4.22–5.81)
RDW: 12.7 % (ref 11.5–14.6)
WBC: 5.9 10*3/uL (ref 4.5–10.5)

## 2013-07-20 MED ORDER — COLCHICINE 0.6 MG PO TABS: 0.6000 mg | ORAL_TABLET | Freq: Every day | ORAL | Status: DC

## 2013-07-20 NOTE — Progress Notes (Signed)
Ramiro Harvest Date of Birth: 1981-06-21 Medical Record #[id number]  History of Present Illness: Euriah is seen back today for a work in visit. Seen for Dr. Shirlee Latch. 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 Patient was initially admitted on 09/18/12 with pleuritic chest pain, fevers, and an ECG classic for acute pericarditis. Initial echo showed no pericardial effusion . He was started on NSAIDs. In the hospital, he remained persistently febrile. He developed ARF and NSAIDs were stopped. He was put on colchicine. He also had an acute rise in his LFTs with unremarkable abdominal ultrasound. ID work up was unremarkable. While in the hospital, he received Lovenox and likely bled into his pericardium. Repeat echoes initially showed a moderate free-flowing pericardial effusion progressing to an organized effusion with evidence for ventricular interdependence. He had actually been scheduled for a pericardial window with Dr. Maren Beach but this was cancelled after Dr. Graciela Husbands spoke with Dr. Erenest Blank at [facility] yesterday. 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 He suggested that we not pursue window at this time and maintain aspirin and colchicine therapy. He was discharged on colchicine on 09/25/12. He was re-admitted on 09/28/12 because of epigastric pain and nausea, diarrhea. ECG still showed diffuse ST elevation of acute pericarditis. He actually had a left heart cath that showed no coronary disease. Symptoms resolved after stopping bid colchicine. He remained febrile and WBCs had increased up to 26,000 this admission. He was re-evaluated by ID with the thought that fever and persistent WBCs may be due to hemopericardium. He was restarted on NSAIDs - this time using ASA. Colchicine was also  restarted but at one a day rather than bid.   Last seen here in December - had had repeat echo with just a small effusion noted. He was doing well clinically. Did not keep his follow up visit.   Comes back today. Here alone. Complains of chest pain. Says that for the last 1 1/2 weeks feels "sand papery" in his chest - worse with lying back - little short of breath. No cough. Does not hurt to take a deep breath. No fever or chills. He admits that he is anxious and almost feels panicky.    Current Outpatient Prescriptions  Medication Sig Dispense Refill  . aspirin 81 MG tablet Take 81 mg by mouth daily.       No current facility-administered medications for this visit.    No Known Allergies  Past Medical History  Diagnosis Date  . Elevated transaminase level/Chronic 09/17/2012    thes date back to age 68.   . Elevated blood pressure 09/17/2012  . Left ventricular hypertrophy 09/17/2012  . Acute pericarditis, unspecified 09/17/2012  . Obesity     on 09/18/12  BMI 35.6 weight 112.2 kg/247#  . STEMI (ST elevation myocardial infarction)     Past Surgical History  Procedure Laterality Date  . Cardiac catheterization      left heart cath    History  Smoking status  . Never Smoker   Smokeless tobacco  . Former Neurosurgeon  . Types: Snuff  . Quit date: 09/02/2012    History  Alcohol Use  . Yes    History reviewed. No pertinent family history.  Review of Systems: The review of systems is per the HPI.  All other systems were reviewed and are negative.  Physical Exam:  BP 120/70  Pulse 96  Ht 5\' 10"  (1.778 m)  Wt 243 lb 12.8 oz (110.587 kg)  BMI 34.98 kg/m2 Patient is pleasant and in no acute distress. Seems a little anxious. Skin is warm and dry. Color is normal.  HEENT is unremarkable. Normocephalic/atraumatic. PERRL. Sclera are nonicteric. Neck is supple. No masses. No JVD. Lungs are clear. Cardiac exam shows a regular rate and rhythm. No rub that I could appreciate.  Abdomen  is soft. Extremities are without edema. Gait and ROM are intact. No gross neurologic deficits noted.  LABORATORY DATA: EKG today shows sinus with no ST changes. Tracing reviewed with Dr. Shirlee Latch.   Lab Results  Component Value Date   WBC 8.9 10/29/2012   HGB 12.3* 10/29/2012   HCT 35.5* 10/29/2012   PLT 187 10/29/2012   GLUCOSE 116* 10/29/2012   ALT 45 10/15/2012   AST 27 10/15/2012   NA 141 10/29/2012   K 3.4* 10/29/2012   CL 105 10/29/2012   CREATININE 1.00 10/29/2012   BUN 8 10/29/2012   CO2 25 10/29/2012   TSH 0.698 10/29/2012   INR 1.41 09/28/2012    Echo Study Conclusions from November 2013  - Left ventricle: The cavity size was normal. Wall thickness was increased in a pattern of mild LVH. Systolic function was normal. The estimated ejection fraction was in the range of 60% to 65%. - Left atrium: The atrium was mildly dilated. - Pericardium, extracardiac: A small pericardial effusion was identified circumferential to the heart. There was no chamber collapse. Features were not consistent with tamponade physiology.   Assessment / Plan:  Chest pain - has had known pericardial effusion - no rub on exam - EKG looks ok - will discuss with Dr. Shirlee Latch - needs to get his echo updated. Continue aspirin.Restart the colchicine at 0.6 mg daily. Check labs today as well. Further disposition to follow.   Patient is agreeable to this plan and will call if any problems develop in the interim.   Rosalio Macadamia, RN, ANP-C [facility] [address] [place], [place]  [address]

## 2013-07-20 NOTE — Patient Instructions (Addendum)
We need to check labs today  We will get your echo updated - will try to get tomorrow  We will put you back on Colchicine 0.6 mg just once a day with your aspirin  We will let you know how this turns out and then decide when you need to come back  Call the [facility] office at [phone] if you have any questions, problems or concerns.

## 2013-07-21 ENCOUNTER — Ambulatory Visit (HOSPITAL_COMMUNITY): Payer: BC Managed Care – PPO | Attending: Cardiology

## 2013-07-21 ENCOUNTER — Telehealth: Payer: Self-pay | Admitting: *Deleted

## 2013-07-21 DIAGNOSIS — Z09 Encounter for follow-up examination after completed treatment for conditions other than malignant neoplasm: Secondary | ICD-10-CM | POA: Insufficient documentation

## 2013-07-21 DIAGNOSIS — R079 Chest pain, unspecified: Secondary | ICD-10-CM | POA: Insufficient documentation

## 2013-07-21 DIAGNOSIS — R0989 Other specified symptoms and signs involving the circulatory and respiratory systems: Secondary | ICD-10-CM | POA: Insufficient documentation

## 2013-07-21 DIAGNOSIS — R0609 Other forms of dyspnea: Secondary | ICD-10-CM | POA: Insufficient documentation

## 2013-07-21 DIAGNOSIS — R072 Precordial pain: Secondary | ICD-10-CM

## 2013-07-21 DIAGNOSIS — E669 Obesity, unspecified: Secondary | ICD-10-CM | POA: Insufficient documentation

## 2013-07-21 NOTE — Progress Notes (Signed)
Echocardiogram performed.  

## 2013-07-21 NOTE — Telephone Encounter (Signed)
Pt was in office today to get echo wanted to s/w Lawson Fiscal had some questions, I went out to [place] and asked pt what was wrong pt stated last night at work pt had panic attack and I stated we do not  address this issue, I stated need to contact PCP, pt stated will call them as soon as pt leaves the office

## 2013-07-22 ENCOUNTER — Encounter: Payer: Self-pay | Admitting: Cardiology

## 2013-07-22 ENCOUNTER — Other Ambulatory Visit: Payer: Self-pay | Admitting: *Deleted

## 2013-07-22 DIAGNOSIS — E876 Hypokalemia: Secondary | ICD-10-CM

## 2013-07-22 NOTE — Telephone Encounter (Signed)
F\up   Pt returning a call about lab results

## 2013-07-28 ENCOUNTER — Other Ambulatory Visit: Payer: BC Managed Care – PPO

## 2013-09-30 IMAGING — CR DG CHEST 2V
2 series · 2 of 2 positions shown · non-contrast
Comparison: 09/18/2012 at 2117 hours the

CLINICAL DATA: Mid chest pain.  Shortness of breath.  Fever.

CHEST - 2 VIEW

[w chest pa]
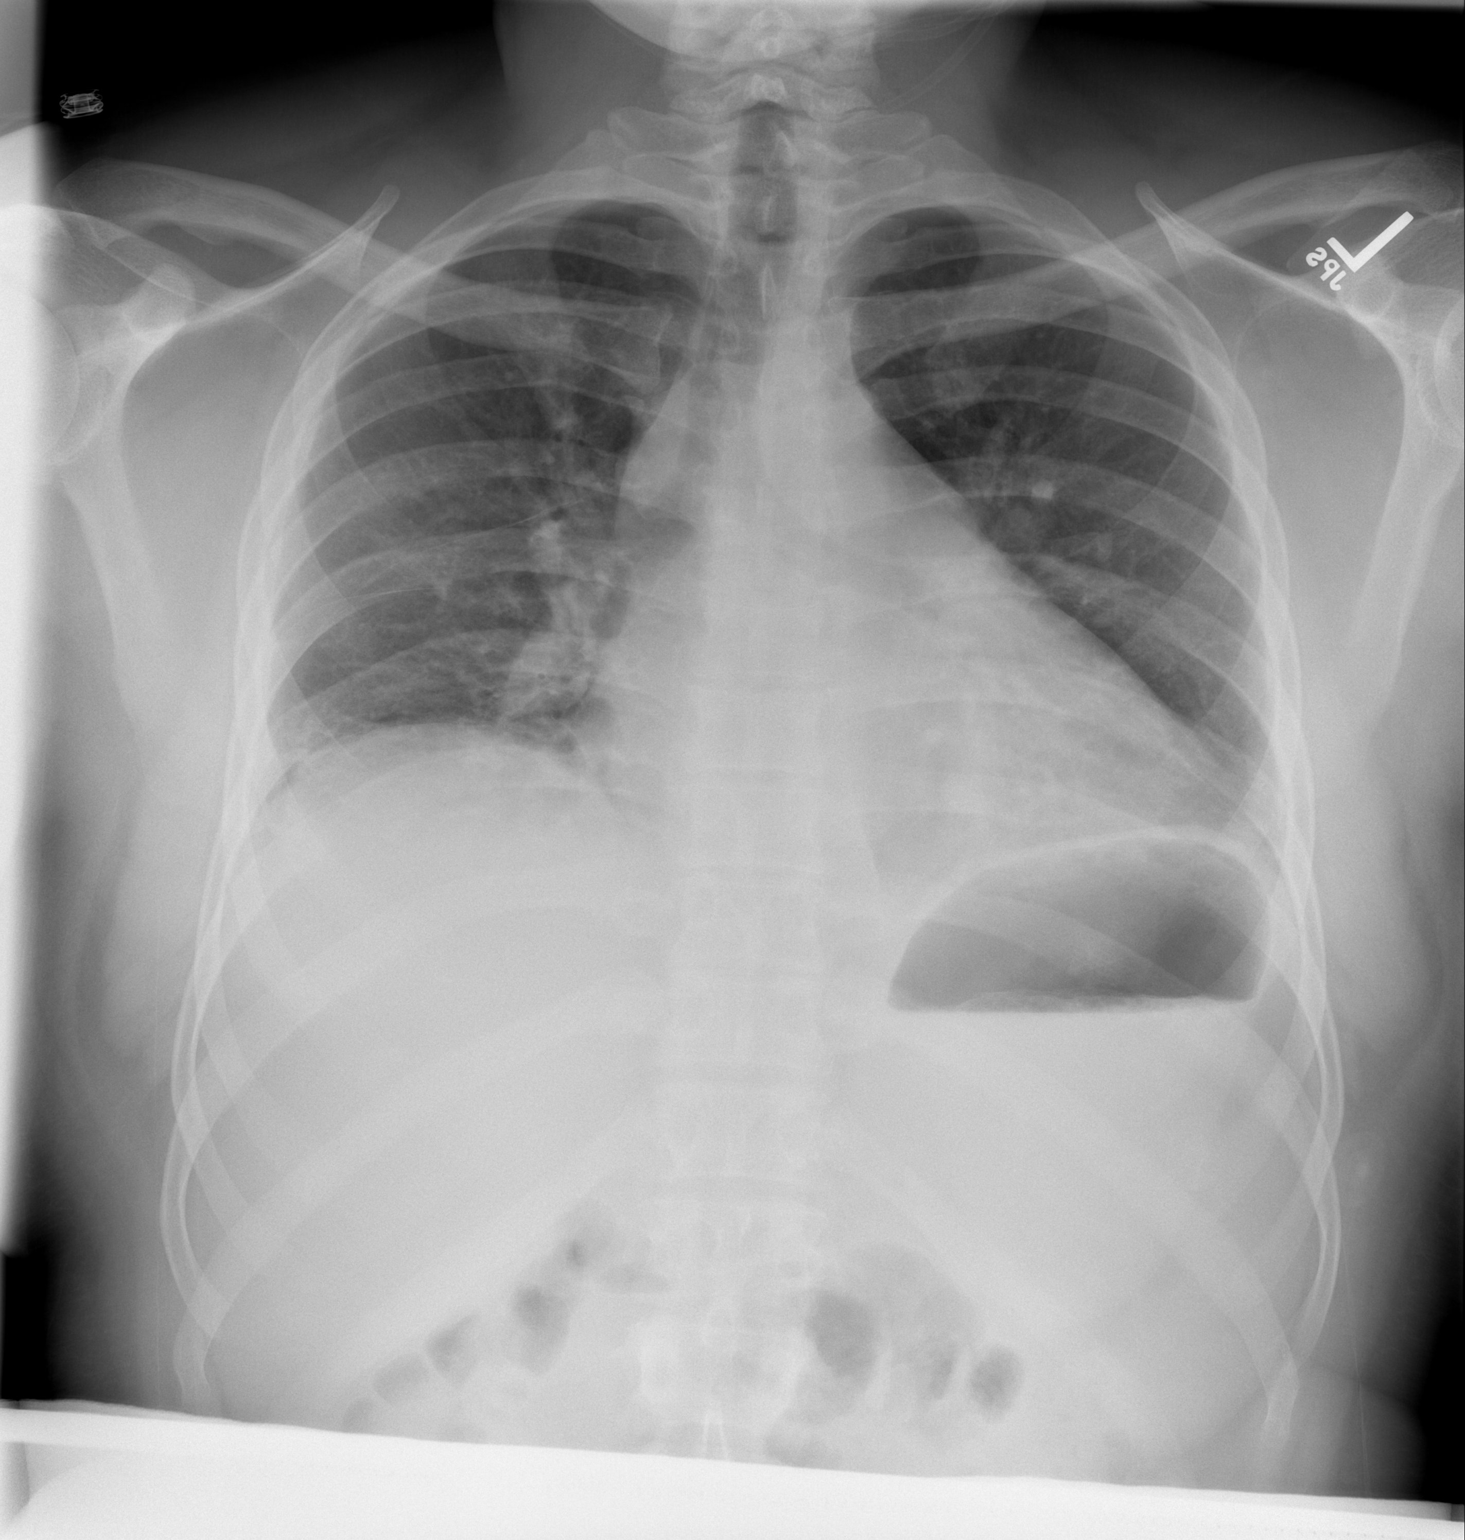

[w chest lat]
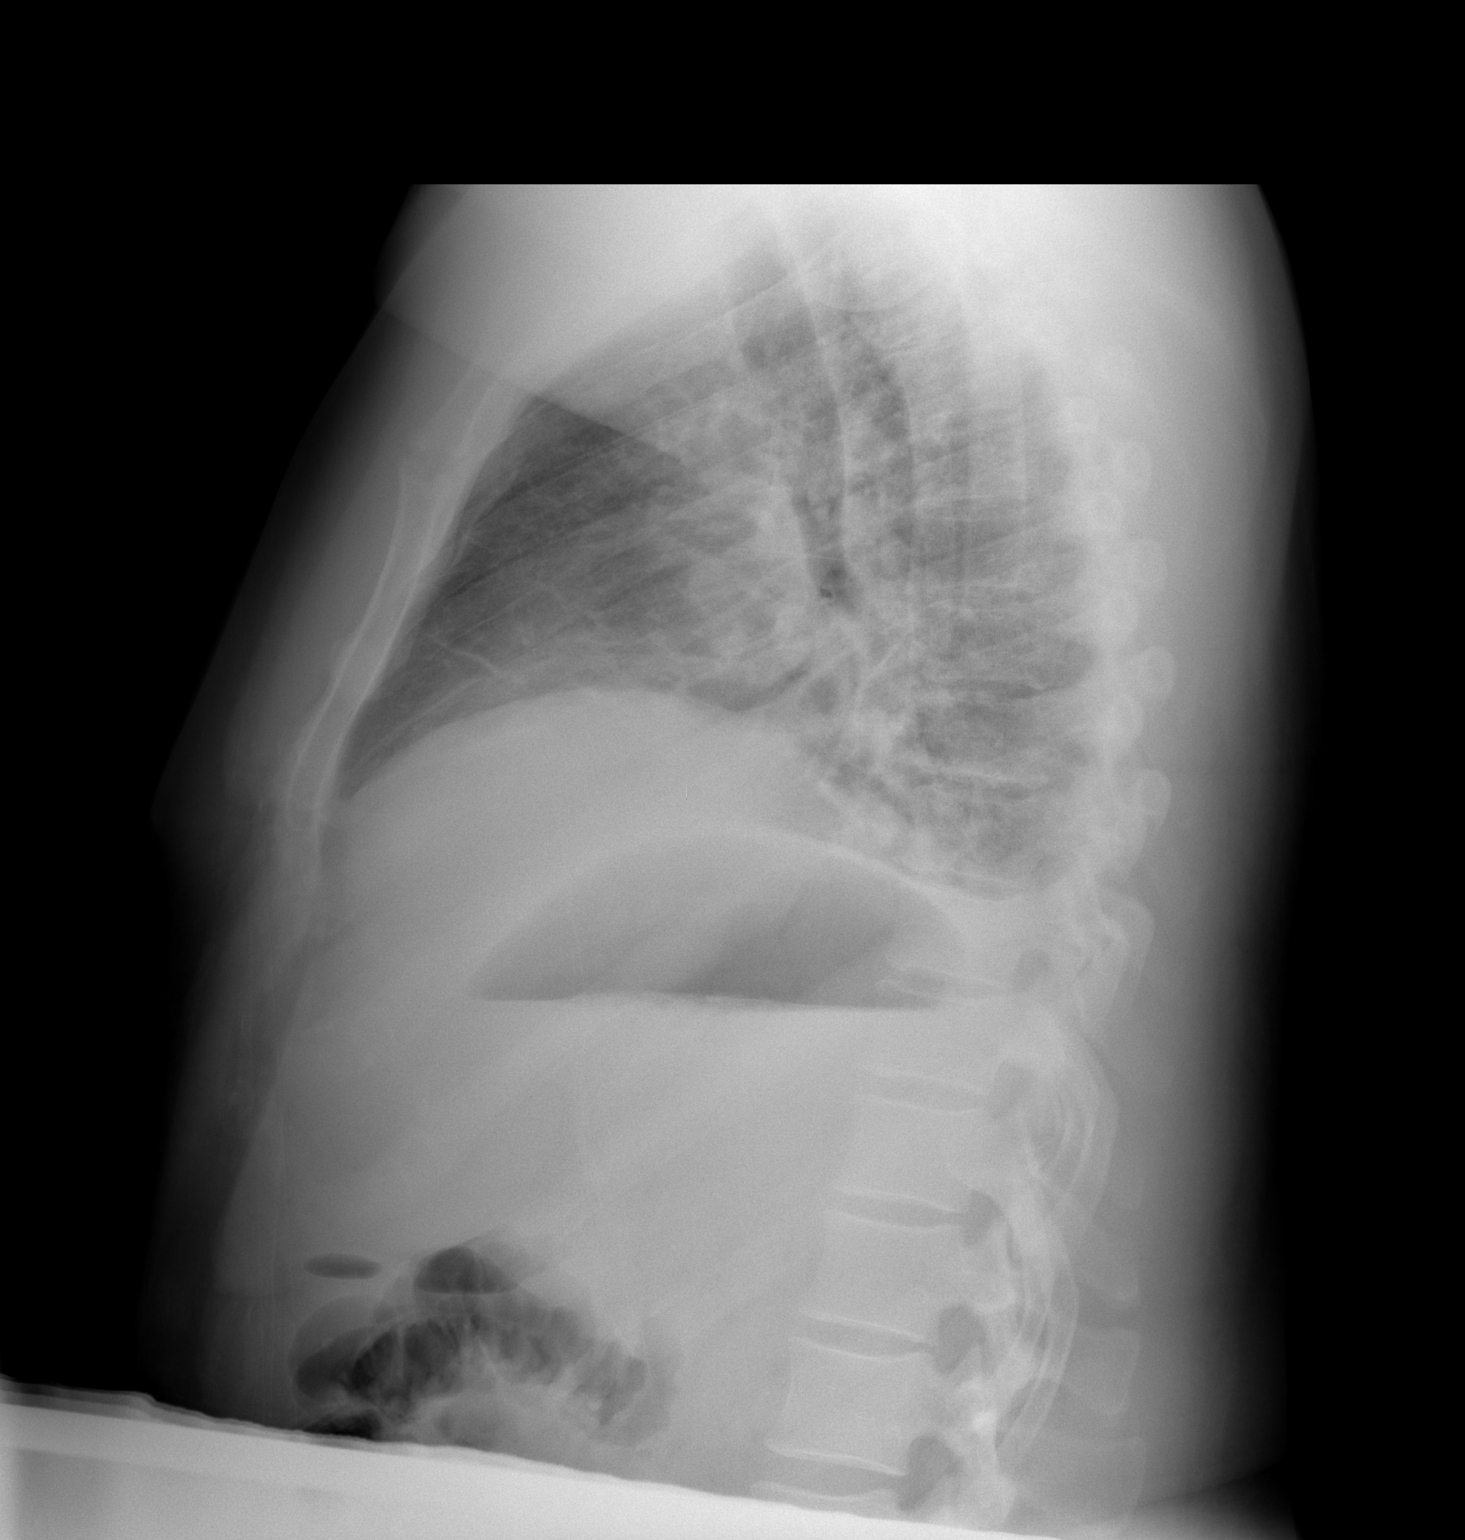

[2 of 2 positions shown; findings below may reference images not displayed]

FINDINGS: Shallow inspiration.  Cardiac enlargement with borderline
pulmonary vascularity.  Infiltration or atelectasis in the lung
bases.  No blunting of costophrenic angles.  No pneumothorax.
IMPRESSION: Cardiac enlargement.  Shallow inspiration with infiltration or
atelectasis in the lung bases.

## 2013-10-02 IMAGING — CR DG CHEST 1V PORT
1 series · 1 of 1 positions shown · non-contrast
Comparison: 09/18/2012

CLINICAL DATA: Hypoxia

PORTABLE CHEST - 1 VIEW

[AP]
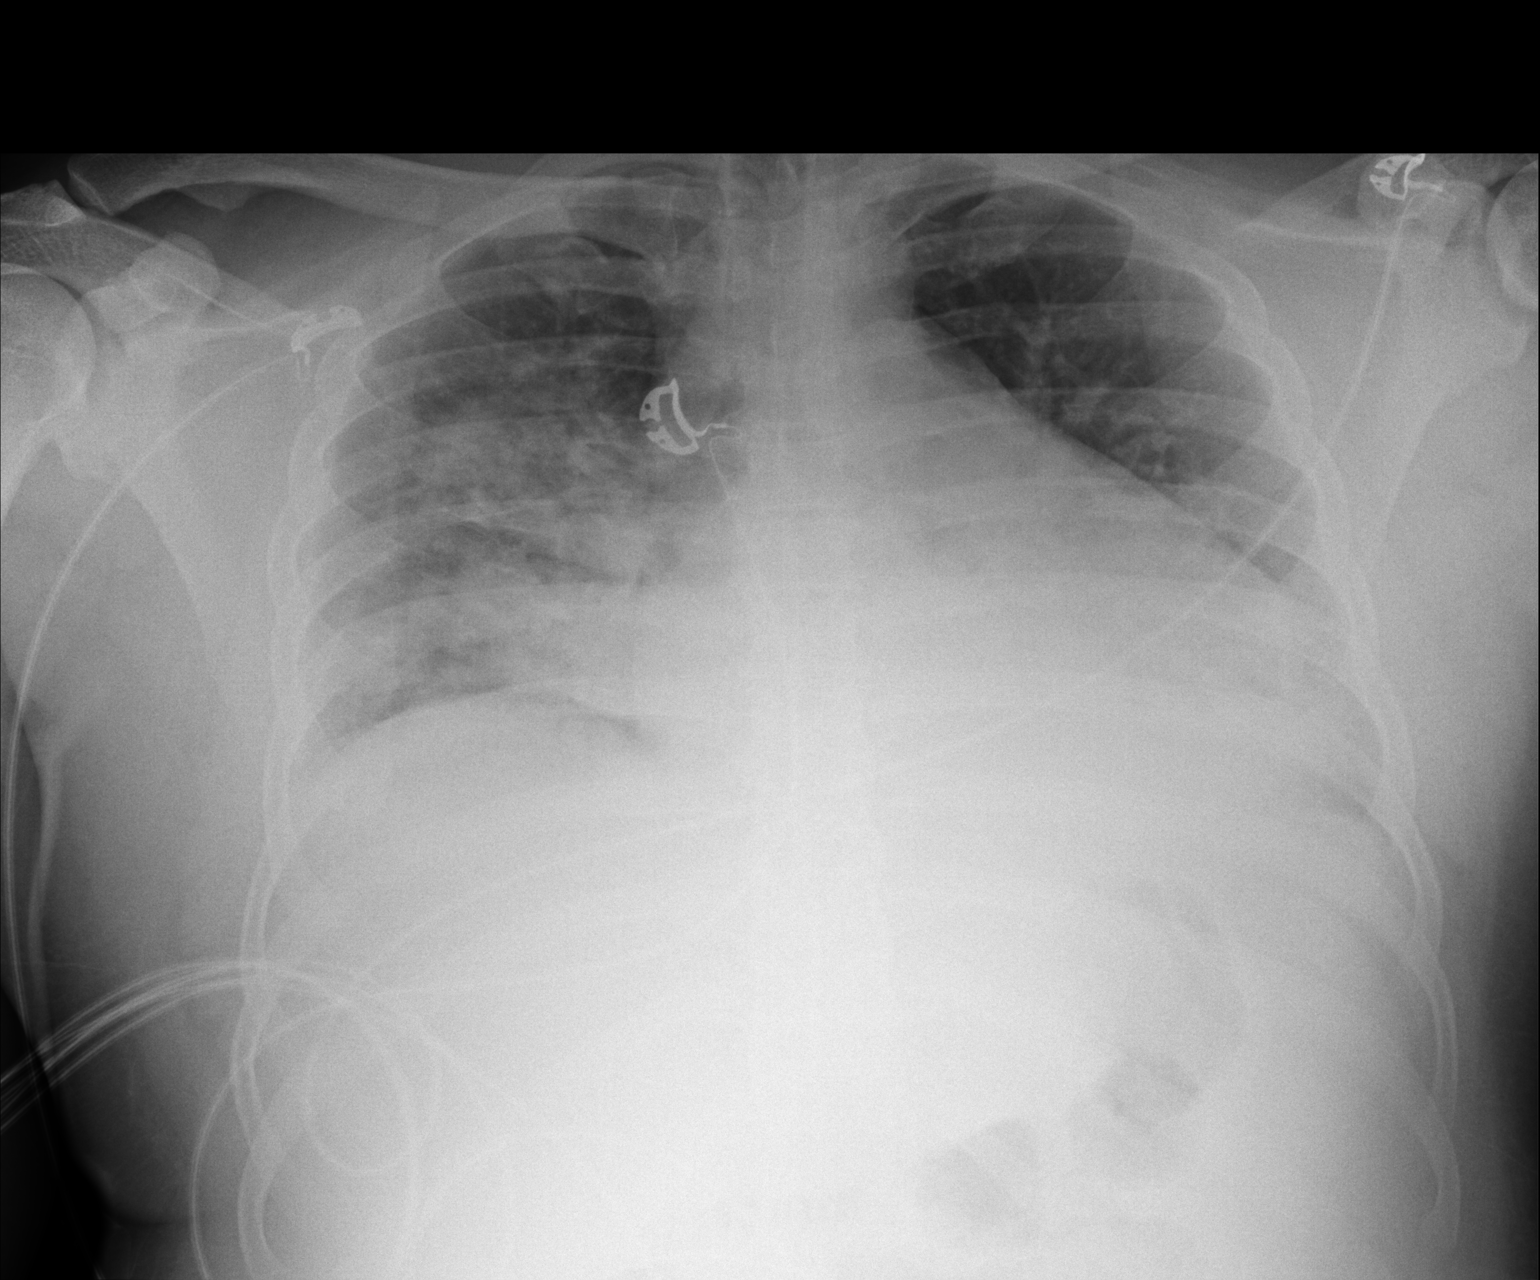

[1 of 1 positions shown; findings below may reference images not displayed]

FINDINGS: Interval development of extensive bilateral airspace
disease in the lung bases.  This may be pneumonia or edema.
Hypoventilation with decreased lung volume.  No significant pleural
effusion.
IMPRESSION: Interval development of extensive bibasilar airspace disease which
may represent pneumonia or pulmonary edema.

## 2013-10-11 IMAGING — CR DG CHEST 2V
2 series · 2 of 2 positions shown · non-contrast
Comparison: 09/21/2012

CLINICAL DATA: Chest pain and irregular heart rate.

CHEST - 2 VIEW

[w chest pa]
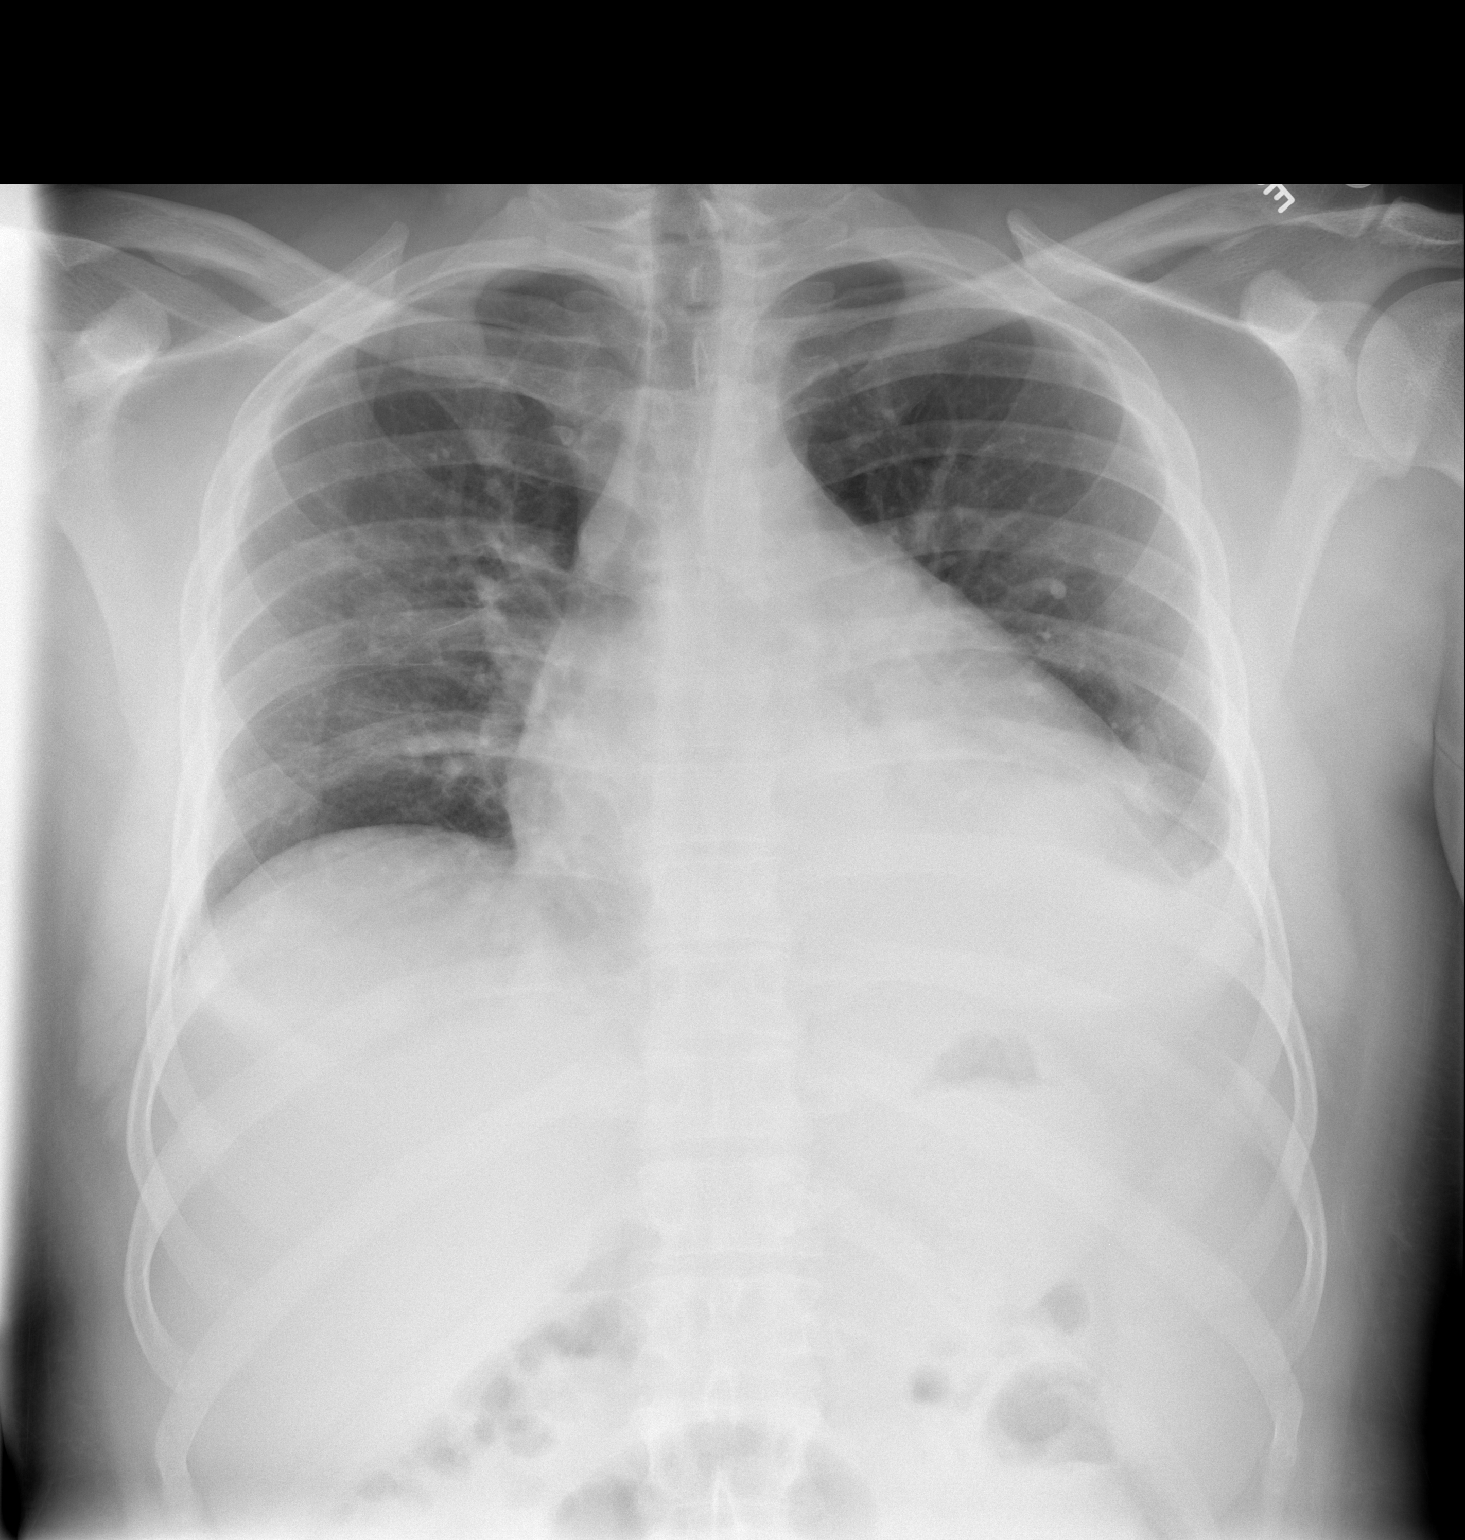

[w chest lat]
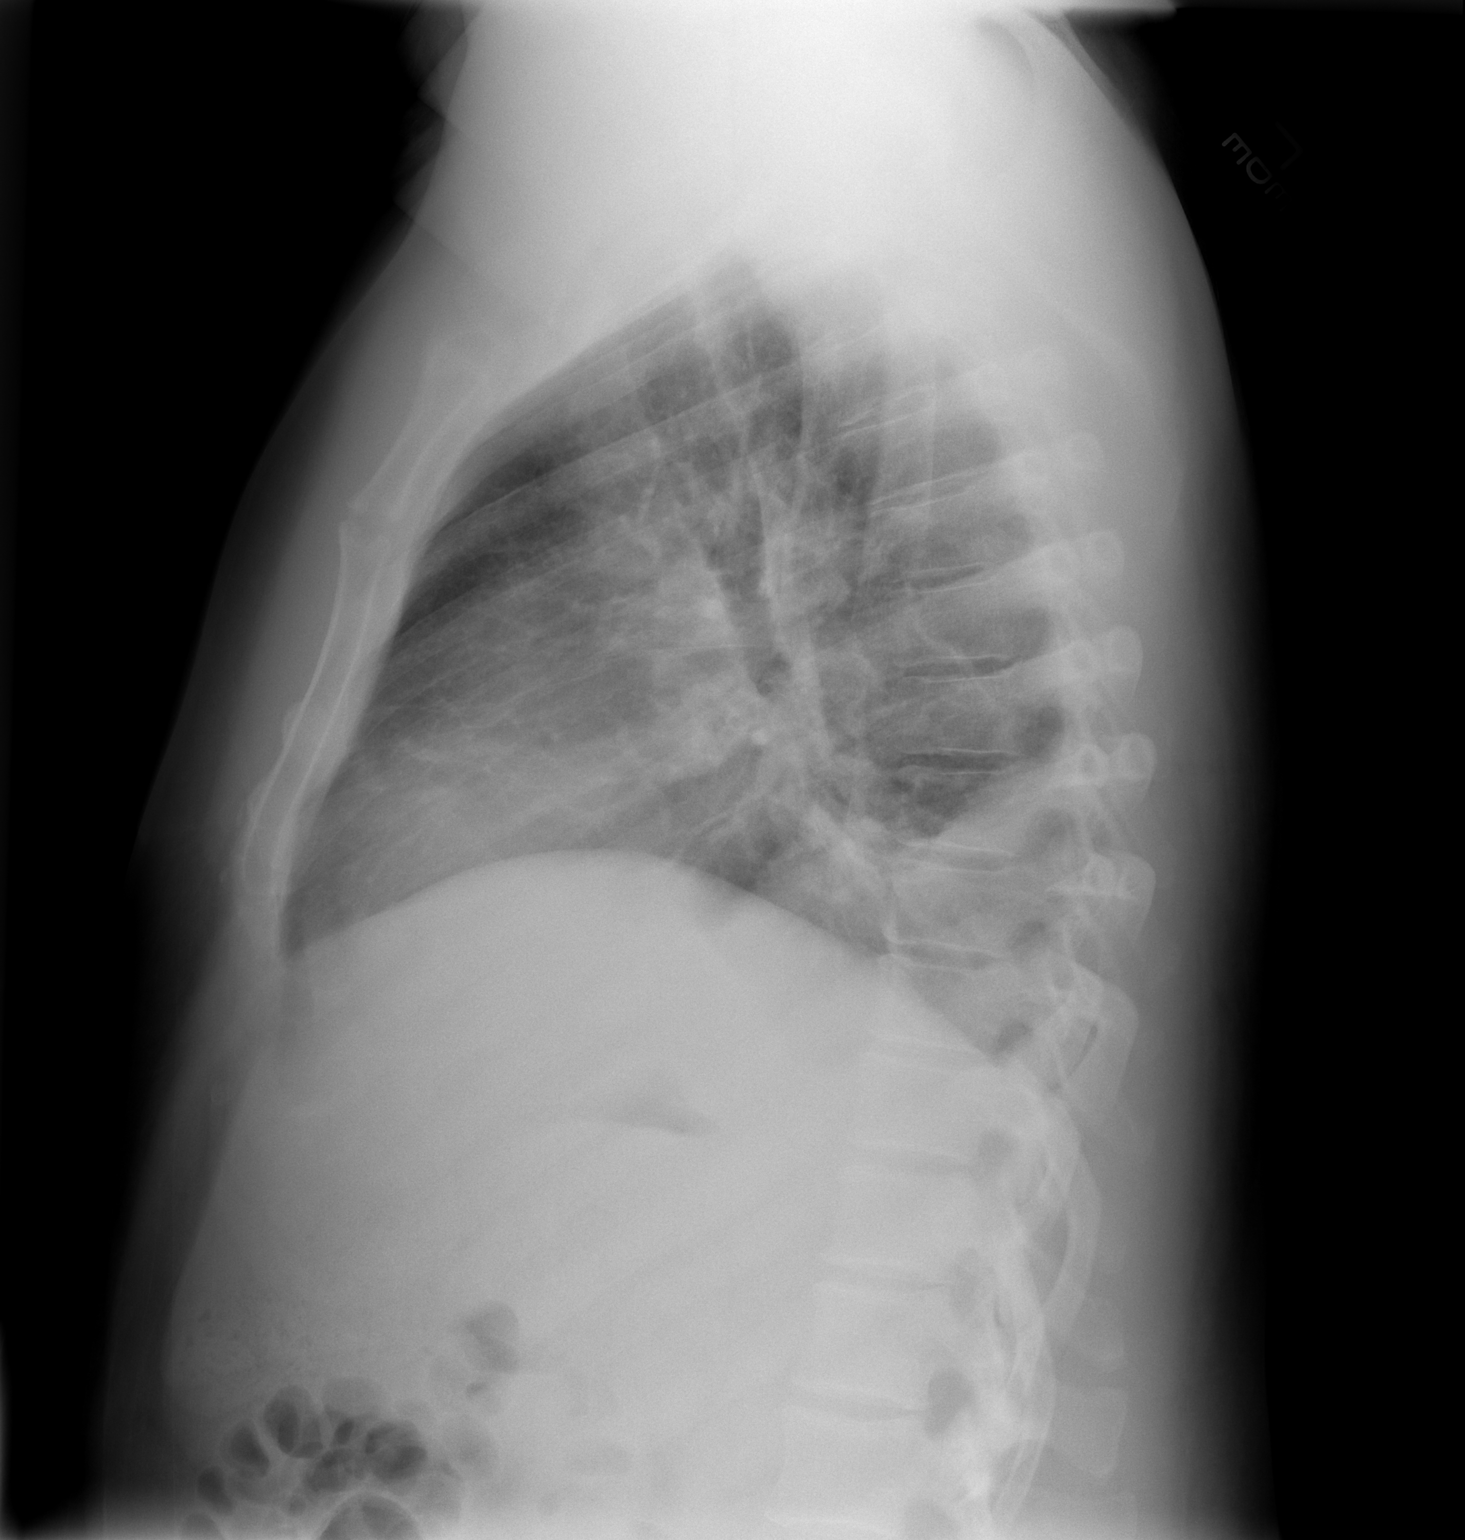

[2 of 2 positions shown; findings below may reference images not displayed]

FINDINGS: Stable cardiac enlargement.  Edema pattern has improved
since the prior study with some increased prominence of pulmonary
vascularity remaining.  There also is a small left pleural
effusion.
IMPRESSION: Stable cardiomegaly and decrease in pulmonary edema.  Small left
pleural effusion present.

## 2014-03-16 NOTE — Telephone Encounter (Signed)
This encounter was created in error - please disregard.

## 2014-11-11 ENCOUNTER — Encounter (HOSPITAL_COMMUNITY): Payer: Self-pay | Admitting: Cardiovascular Disease

## 2016-11-13 ENCOUNTER — Ambulatory Visit (INDEPENDENT_AMBULATORY_CARE_PROVIDER_SITE_OTHER): Payer: BLUE CROSS/BLUE SHIELD | Admitting: Family Medicine

## 2016-11-13 VITALS — BP 130/90 | HR 92 | Temp 98.5°F | Resp 17 | Ht 70.0 in | Wt 243.0 lb

## 2016-11-13 DIAGNOSIS — J0110 Acute frontal sinusitis, unspecified: Secondary | ICD-10-CM | POA: Diagnosis not present

## 2016-11-13 DIAGNOSIS — H6090 Unspecified otitis externa, unspecified ear: Secondary | ICD-10-CM

## 2016-11-13 MED ORDER — AMOXICILLIN-POT CLAVULANATE 875-125 MG PO TABS: 1.0000 | ORAL_TABLET | Freq: Two times a day (BID) | ORAL | 0 refills | Status: DC

## 2016-11-13 MED ORDER — HYDROCOD POLST-CPM POLST ER 10-8 MG/5ML PO SUER: 5.0000 mL | Freq: Two times a day (BID) | ORAL | 0 refills | Status: DC | PRN

## 2016-11-13 NOTE — Patient Instructions (Addendum)
Take Start Augmentin 1 tablet twice daily with food to avoid stomach upset. Complete all medication.  Take Tussionex 5 ml every 12 hours as needed for cough.   IF you received an x-ray today, you will receive an invoice from [facility]. Please contact [facility] at [phone] with questions or concerns regarding your invoice.   IF you received labwork today, you will receive an invoice from [facility]. Please contact [facility] at [phone] with questions or concerns regarding your invoice.   Our billing staff will not be able to assist you with questions regarding bills from these companies.  You will be contacted with the lab results as soon as they are available. The fastest way to get your results is to activate your My Chart account. Instructions are located on the last page of this paperwork. If you have not heard from us regarding the results in 2 weeks, please contact this office.      Sinusitis, Adult Sinusitis is soreness and inflammation of your sinuses. Sinuses are hollow spaces in the bones around your face. They are located:  Around your eyes.  In the middle of your forehead.  Behind your nose.  In your cheekbones. Your sinuses and nasal passages are lined with a stringy fluid (mucus). Mucus normally drains out of your sinuses. When your nasal tissues get inflamed or swollen, the mucus can get trapped or blocked so air cannot flow through your sinuses. This lets bacteria, viruses, and funguses grow, and that leads to infection. Follow these instructions at home: Medicines  Take, use, or apply over-the-counter and prescription medicines only as told by your doctor. These may include nasal sprays.  If you were prescribed an antibiotic medicine, take it as told by your doctor. Do not stop taking the antibiotic even if you start to feel better. Hydrate and Humidify  Drink enough water to keep your pee (urine) clear or  pale yellow.  Use a cool mist humidifier to keep the humidity level in your home above 50%.  Breathe in steam for 10-15 minutes, 3-4 times a day or as told by your doctor. You can do this in the bathroom while a hot shower is running.  Try not to spend time in cool or dry air. Rest  Rest as much as possible.  Sleep with your head raised (elevated).  Make sure to get enough sleep each night. General instructions  Put a warm, moist washcloth on your face 3-4 times a day or as told by your doctor. This will help with discomfort.  Wash your hands often with soap and water. If there is no soap and water, use hand sanitizer.  Do not smoke. Avoid being around people who are smoking (secondhand smoke).  Keep all follow-up visits as told by your doctor. This is important. Contact a doctor if:  You have a fever.  Your symptoms get worse.  Your symptoms do not get better within 10 days. Get help right away if:  You have a very bad headache.  You cannot stop throwing up (vomiting).  You have pain or swelling around your face or eyes.  You have trouble seeing.  You feel confused.  Your neck is stiff.  You have trouble breathing. This information is not intended to replace advice given to you by your health care provider. Make sure you discuss any questions you have with your health care provider. Document Released: 05/07/2008 Document Revised: 07/15/2016 Document Reviewed: 09/14/2015 Elsevier Interactive Patient Education  2017  [employer].

## 2016-11-13 NOTE — Progress Notes (Signed)
   Patient ID: Michael [name], male    DOB: 10/10/1981, 35 y.o.   MRN: [id number]  PCP: Default, Provider, MD  Chief Complaint  Patient presents with  . Cough    Productive. Since thursday. Body aches. Chills.     Subjective:   HPI 35 year old male presents for evaluation of productive cough with body aches and chills x 6 days.[facility]. Coughing, with body aches and chills.  Shortness of breath with worsening last night. Frontal  Headache. Sore throat and bilateral ear fullness. Denies chest tightness or aches.Taken Nyquil and Alka-Selter.  Social History   Social History  . Marital status: Married    Spouse name: N/A  . Number of children: N/A  . Years of education: N/A   Occupational History  . Not on file.   Social History Main Topics  . Smoking status: Never Smoker  . Smokeless tobacco: Former NeurosurgeonUser    Types: Snuff    Quit date: 09/02/2012  . Alcohol use Yes  . Drug use: Unknown  . Sexual activity: Not Currently   Other Topics Concern  . Not on file   Social History Narrative  . No narrative on file    No family history on file.  Review of Systems See HPI  Patient Active Problem List   Diagnosis Date Noted  . Acute pericarditis, unspecified 09/29/2012  . Leukocytosis 09/29/2012  . Hypertension 09/18/2012  . Elevated transaminase level/Chronic 09/17/2012  . Left ventricular hypertrophy 09/17/2012  . Elevated blood pressure 09/17/2012  . Obstructive sleep apnea 09/17/2012     Prior to Admission medications   Not on File  No Known Allergies     Objective:  Physical Exam  Constitutional: He is oriented to person, place, and time. He appears well-developed and well-nourished.  HENT:  Right Ear: There is swelling. A middle ear effusion is present.  Left Ear: There is swelling. A middle ear effusion is present.  Nose: Mucosal edema and rhinorrhea present. Right sinus exhibits frontal sinus tenderness. Left sinus exhibits frontal sinus  tenderness.  Bilateral canals edema and erythematous  Cardiovascular: Normal rate, regular rhythm, normal heart sounds and intact distal pulses.   Pulmonary/Chest: Effort normal and breath sounds normal.  Musculoskeletal: Normal range of motion.  Neurological: He is alert and oriented to person, place, and time.  Skin: Skin is warm and dry.  Psychiatric: He has a normal mood and affect. His behavior is normal. Judgment and thought content normal.    Vitals:   11/13/16 0936  BP: 130/90  Pulse: 92  Resp: 17  Temp: 98.5 F (36.9 C)   Assessment & Plan:  1. Acute frontal sinusitis, recurrence not specified 2. Otitis externa, unspecified chronicity, unspecified laterality, unspecified type  Plan: . chlorpheniramine-HYDROcodone (TUSSIONEX PENNKINETIC ER) 10-8 MG/5ML SUER    Sig: Take 5 mLs by mouth every 12 (twelve) hours as needed for cough.    Dispense:  100 mL  . amoxicillin-clavulanate (AUGMENTIN) 875-125 MG tablet    Sig: Take 1 tablet by mouth 2 (two) times daily.    Dispense:  20 tablet    Godfrey PickKimberly S. Tiburcio PeaHarris, MSN, FNP-C [facility]

## 2016-12-10 ENCOUNTER — Ambulatory Visit (INDEPENDENT_AMBULATORY_CARE_PROVIDER_SITE_OTHER): Payer: BLUE CROSS/BLUE SHIELD | Admitting: Emergency Medicine

## 2016-12-10 VITALS — BP 151/92 | HR 89 | Temp 98.5°F | Resp 16 | Ht 70.0 in | Wt 229.0 lb

## 2016-12-10 DIAGNOSIS — J029 Acute pharyngitis, unspecified: Secondary | ICD-10-CM | POA: Diagnosis not present

## 2016-12-10 DIAGNOSIS — J040 Acute laryngitis: Secondary | ICD-10-CM

## 2016-12-10 DIAGNOSIS — H9203 Otalgia, bilateral: Secondary | ICD-10-CM

## 2016-12-10 DIAGNOSIS — R070 Pain in throat: Secondary | ICD-10-CM

## 2016-12-10 MED ORDER — BENZOCAINE 10 MG MT LOZG: 1.0000 | LOZENGE | Freq: Four times a day (QID) | OROMUCOSAL | 0 refills | Status: DC | PRN

## 2016-12-10 MED ORDER — PREDNISONE 20 MG PO TABS: 40.0000 mg | ORAL_TABLET | Freq: Every day | ORAL | 0 refills | Status: AC

## 2016-12-10 MED ORDER — AMOXICILLIN-POT CLAVULANATE 875-125 MG PO TABS: 1.0000 | ORAL_TABLET | Freq: Two times a day (BID) | ORAL | 0 refills | Status: DC

## 2016-12-10 NOTE — Progress Notes (Signed)
Michael [name] 36 y.o.   Chief Complaint  Patient presents with  . Sore Throat  . Ear Pain    HISTORY OF PRESENT ILLNESS: This is a 36 y.o. male complaining of sore throat and ear pain.  Sore Throat   This is a new problem. The current episode started in the past 7 days. The problem has been gradually worsening. Neither side of throat is experiencing more pain than the other. Maximum temperature: maybe. The pain is at a severity of 7/10. The pain is moderate. Associated symptoms include congestion, coughing, ear pain, headaches, a hoarse voice, swollen glands and trouble swallowing. Pertinent negatives include no abdominal pain, diarrhea, drooling, ear discharge, neck pain, shortness of breath, stridor or vomiting.     Prior to Admission medications   Medication Sig Start Date End Date Taking? Authorizing Provider  amoxicillin-clavulanate (AUGMENTIN) 875-125 MG tablet Take 1 tablet by mouth 2 (two) times daily. 12/10/16   Michael [name], Michael [name]  Benzocaine 10 MG LOZG Use as directed 1 lozenge (10 mg total) in the mouth or throat 4 (four) times daily as needed. 12/10/16   Olukemi Panchal Victorino DecemberJose Norton Bivins, Michael [name]  predniSONE (DELTASONE) 20 MG tablet Take 2 tablets (40 mg total) by mouth daily with breakfast. 12/10/16 12/15/16  Michael [name], Michael [name]    No Known Allergies  Patient Active Problem List   Diagnosis Date Noted  . Acute pericarditis, unspecified 09/29/2012  . Leukocytosis 09/29/2012  . Hypertension 09/18/2012  . Elevated transaminase level/Chronic 09/17/2012  . Left ventricular hypertrophy 09/17/2012  . Elevated blood pressure 09/17/2012  . Obstructive sleep apnea 09/17/2012    Past Medical History:  Diagnosis Date  . Acute pericarditis, unspecified 09/17/2012  . Elevated blood pressure 09/17/2012  . Elevated transaminase level/Chronic 09/17/2012   thes date back to age 36.   . Left ventricular hypertrophy 09/17/2012  . Obesity    on 09/18/12  BMI 35.6 weight 112.2 kg/247#  .  STEMI (ST elevation myocardial infarction) [facility](HCC)     Past Surgical History:  Procedure Laterality Date  . CARDIAC CATHETERIZATION     left heart cath  . LEFT HEART CATHETERIZATION WITH CORONARY ANGIOGRAM N/A 09/28/2012   Procedure: LEFT HEART CATHETERIZATION WITH CORONARY ANGIOGRAM;  Surgeon: Lennette Biharihomas A Kelly, Michael [name];  [facility];  Service: Cardiovascular;  Laterality: N/A;    Social History   Social History  . Marital status: Married    Spouse name: N/A  . Number of children: N/A  . Years of education: N/A   Occupational History  . Not on file.   Social History Main Topics  . Smoking status: Never Smoker  . Smokeless tobacco: Former NeurosurgeonUser    Types: Snuff    Quit date: 09/02/2012  . Alcohol use Yes  . Drug use: Unknown  . Sexual activity: Not Currently   Other Topics Concern  . Not on file   Social History Narrative  . No narrative on file    History reviewed. No pertinent family history.   Review of Systems  Constitutional: Negative for chills and fever.  HENT: Positive for congestion, ear pain, hoarse voice, sore throat and trouble swallowing. Negative for drooling, ear discharge, nosebleeds and sinus pain.   Eyes: Negative.   Respiratory: Positive for cough. Negative for hemoptysis, shortness of breath and stridor.   Cardiovascular: Negative for chest pain, palpitations and leg swelling.  Gastrointestinal: Negative for abdominal pain, diarrhea and vomiting.  Genitourinary: Negative.   Musculoskeletal: Negative for neck pain.  Skin:  Negative.  Negative for rash.  Neurological: Positive for headaches. Negative for dizziness.  Endo/Heme/Allergies: Negative.   Psychiatric/Behavioral: Negative.   All other systems reviewed and are negative.   Vitals:   12/10/16 0902  BP: (!) 151/92  Pulse: 89  Resp: 16  Temp: 98.5 F (36.9 C)    Physical Exam  Constitutional: He is oriented to person, place, and time. He appears well-developed and well-nourished.    HENT:  Head: Normocephalic and atraumatic.  Right Ear: Tympanic membrane is erythematous.  Left Ear: Tympanic membrane is erythematous.  Nose: Mucosal edema present.  Mouth/Throat: Mucous membranes are normal. Posterior oropharyngeal edema (mild) and posterior oropharyngeal erythema present. No tonsillar abscesses.  Eyes: Conjunctivae and EOM are normal. Pupils are equal, round, and reactive to light.  Cardiovascular: Normal rate, regular rhythm and normal heart sounds.   Pulmonary/Chest: Effort normal and breath sounds normal.  Abdominal: Soft. He exhibits no mass. There is no tenderness.  Musculoskeletal: Normal range of motion.  Lymphadenopathy:    He has no cervical adenopathy.  Neurological: He is alert and oriented to person, place, and time.  Skin: Skin is warm and dry. Capillary refill takes less than 2 seconds.  Psychiatric: He has a normal mood and affect. His behavior is normal.  Vitals reviewed.    ASSESSMENT & PLAN: Michael [name] was seen today for sore throat and ear pain.  Diagnoses and all orders for this visit:  Acute pharyngitis, unspecified etiology  Earache symptoms, bilateral  Throat pain in adult  Laryngitis  Other orders -     amoxicillin-clavulanate (AUGMENTIN) 875-125 MG tablet; Take 1 tablet by mouth 2 (two) times daily. -     predniSONE (DELTASONE) 20 MG tablet; Take 2 tablets (40 mg total) by mouth daily with breakfast. -     Benzocaine 10 MG LOZG; Use as directed 1 lozenge (10 mg total) in the mouth or throat 4 (four) times daily as needed.    Patient Instructions       IF you received an x-ray today, you will receive an invoice from [facility]. Please contact [facility] at [phone] with questions or concerns regarding your invoice.   IF you received labwork today, you will receive an invoice from [place]. Please contact [facility] at [phone] with questions or concerns regarding your invoice.   Our billing staff will  not be able to assist you with questions regarding bills from these companies.  You will be contacted with the lab results as soon as they are available. The fastest way to get your results is to activate your My Chart account. Instructions are located on the last page of this paperwork. If you have not heard from [place] regarding the results in 2 weeks, please contact this office.     Pharyngitis Pharyngitis is a sore throat (pharynx). There is redness, pain, and swelling of your throat. Follow these instructions at home:  Drink enough fluids to keep your pee (urine) clear or pale yellow.  Only take medicine as told by your doctor.  You may get sick again if you do not take medicine as told. Finish your medicines, even if you start to feel better.  Do not take aspirin.  Rest.  Rinse your mouth (gargle) with salt water ( tsp of salt per 1 qt of water) every 1-2 hours. This will help the pain.  If you are not at risk for choking, you can suck on hard candy or sore throat lozenges. Contact a doctor if:  You  have large, tender lumps on your neck.  You have a rash.  You cough up green, yellow-brown, or bloody spit. Get help right away if:  You have a stiff neck.  You drool or cannot swallow liquids.  You throw up (vomit) or are not able to keep medicine or liquids down.  You have very bad pain that does not go away with medicine.  You have problems breathing (not from a stuffy nose). This information is not intended to replace advice given to you by your health care provider. Make sure you discuss any questions you have with your health care provider. Document Released: 05/07/2008 Document Revised: 04/26/2016 Document Reviewed: 07/27/2013 Elsevier Interactive Patient Education  2017 Elsevier Inc.      Edwina Barth, Michael [name] [facility]

## 2016-12-10 NOTE — Patient Instructions (Addendum)
     IF you received an x-ray today, you will receive an invoice from [facility]. Please contact [facility] at [phone] with questions or concerns regarding your invoice.   IF you received labwork today, you will receive an invoice from [facility]. Please contact [facility] at [phone] with questions or concerns regarding your invoice.   Our billing staff will not be able to assist you with questions regarding bills from these companies.  You will be contacted with the lab results as soon as they are available. The fastest way to get your results is to activate your My Chart account. Instructions are located on the last page of this paperwork. If you have not heard from us regarding the results in 2 weeks, please contact this office.     Pharyngitis Pharyngitis is a sore throat (pharynx). There is redness, pain, and swelling of your throat. Follow these instructions at home:  Drink enough fluids to keep your pee (urine) clear or pale yellow.  Only take medicine as told by your doctor.  You may get sick again if you do not take medicine as told. Finish your medicines, even if you start to feel better.  Do not take aspirin.  Rest.  Rinse your mouth (gargle) with salt water ( tsp of salt per 1 qt of water) every 1-2 hours. This will help the pain.  If you are not at risk for choking, you can suck on hard candy or sore throat lozenges. Contact a doctor if:  You have large, tender lumps on your neck.  You have a rash.  You cough up green, yellow-brown, or bloody spit. Get help right away if:  You have a stiff neck.  You drool or cannot swallow liquids.  You throw up (vomit) or are not able to keep medicine or liquids down.  You have very bad pain that does not go away with medicine.  You have problems breathing (not from a stuffy nose). This information is not intended to replace advice given to you by your health care provider. Make sure you  discuss any questions you have with your health care provider. Document Released: 05/07/2008 Document Revised: 04/26/2016 Document Reviewed: 07/27/2013 Elsevier Interactive Patient Education  2017 Elsevier Inc.  

## 2018-02-24 DIAGNOSIS — M25561 Pain in right knee: Secondary | ICD-10-CM | POA: Diagnosis not present

## 2021-09-15 ENCOUNTER — Emergency Department (HOSPITAL_BASED_OUTPATIENT_CLINIC_OR_DEPARTMENT_OTHER)
Admission: EM | Admit: 2021-09-15 | Discharge: 2021-09-15 | Disposition: A | Discharge status: Home / Self Care | Payer: Worker's Compensation | Attending: Emergency Medicine | Admitting: Emergency Medicine

## 2021-09-15 ENCOUNTER — Other Ambulatory Visit: Payer: Self-pay

## 2021-09-15 ENCOUNTER — Encounter (HOSPITAL_BASED_OUTPATIENT_CLINIC_OR_DEPARTMENT_OTHER): Payer: Self-pay | Admitting: *Deleted

## 2021-09-15 DIAGNOSIS — Y990 Civilian activity done for income or pay: Secondary | ICD-10-CM | POA: Insufficient documentation

## 2021-09-15 DIAGNOSIS — T6591XA Toxic effect of unspecified substance, accidental (unintentional), initial encounter: Secondary | ICD-10-CM | POA: Diagnosis not present

## 2021-09-15 DIAGNOSIS — H10212 Acute toxic conjunctivitis, left eye: Secondary | ICD-10-CM | POA: Diagnosis not present

## 2021-09-15 DIAGNOSIS — H579 Unspecified disorder of eye and adnexa: Secondary | ICD-10-CM | POA: Insufficient documentation

## 2021-09-15 DIAGNOSIS — F17290 Nicotine dependence, other tobacco product, uncomplicated: Secondary | ICD-10-CM | POA: Diagnosis not present

## 2021-09-15 DIAGNOSIS — I10 Essential (primary) hypertension: Secondary | ICD-10-CM | POA: Insufficient documentation

## 2021-09-15 MED ORDER — ERYTHROMYCIN 5 MG/GM OP OINT
TOPICAL_OINTMENT | Freq: Once | OPHTHALMIC | Status: AC
  Filled 2021-09-15: qty 3.5

## 2021-09-15 MED ORDER — TETRACAINE HCL 0.5 % OP SOLN
2.0000 [drp] | Freq: Once | OPHTHALMIC | Status: AC
  Administered 2021-09-15: 2 [drp] via OPHTHALMIC
  Filled 2021-09-15: qty 4

## 2021-09-15 MED ORDER — FLUORESCEIN SODIUM 1 MG OP STRP
1.0000 | ORAL_STRIP | Freq: Once | OPHTHALMIC | Status: AC
  Administered 2021-09-15: 1 via OPHTHALMIC
  Filled 2021-09-15: qty 1

## 2021-09-15 MED ORDER — ERYTHROMYCIN 5 MG/GM OP OINT: TOPICAL_OINTMENT | OPHTHALMIC | 0 refills | Status: DC

## 2021-09-15 NOTE — ED Notes (Signed)
First contact with patient. Patient arrived via triage from home with complaints of chemical exposure to left eye x 30 min ago. Patient complains of discomfort- Patient connected to morgan lens with NS for eye irrigation. MD made aware.  Pt is A&OX 4. Respirations even/unlabored. Patient changed into gown and call light within reach. Patient updated on plan of care. Will continue to monitor patient.

## 2021-09-15 NOTE — ED Triage Notes (Addendum)
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 He used a eye wash station to wash his eye. Tearing.

## 2021-09-15 NOTE — ED Provider Notes (Signed)
[facility] EMERGENCY DEPARTMENT Provider Note   CSN: [id number] Arrival date & time: 09/15/21  1633     History Chief Complaint  Patient presents with   Eye Problem    Michael [name] is a 40 y.o. male.  The history is provided by the patient.  Eye Problem Location:  Left eye Quality:  Aching Severity:  Moderate Onset quality:  Gradual Timing:  Constant Progression:  Improving Chronicity:  New Context: chemical exposure   Relieved by:  Commercial eye wash Worsened by:  Nothing Associated symptoms: inflammation, itching and tearing   Associated symptoms: no blurred vision, no crusting, no decreased vision, no discharge, no double vision, no facial rash, no headaches, no nausea, no numbness, no redness, no scotomas, no swelling, no tingling, no vomiting and no weakness       Past Medical History:  Diagnosis Date   Acute pericarditis, unspecified 09/17/2012   Elevated blood pressure 09/17/2012   Elevated transaminase level/Chronic 09/17/2012   thes date back to age 79.    Left ventricular hypertrophy 09/17/2012   Obesity    on 09/18/12  BMI 35.6 weight 112.2 kg/247#   STEMI (ST elevation myocardial infarction) [facility]     Patient Active Problem List   Diagnosis Date Noted   Acute pericarditis, unspecified 09/29/2012   Leukocytosis 09/29/2012   Hypertension 09/18/2012   Elevated transaminase level/Chronic 09/17/2012   Left ventricular hypertrophy 09/17/2012   Elevated blood pressure 09/17/2012   Obstructive sleep apnea 09/17/2012    Past Surgical History:  Procedure Laterality Date   CARDIAC CATHETERIZATION     left heart cath   LEFT HEART CATHETERIZATION WITH CORONARY ANGIOGRAM N/A 09/28/2012   Procedure: LEFT HEART CATHETERIZATION WITH CORONARY ANGIOGRAM;  Surgeon: Lennette Bihari, MD;  Location: [facility] CATH LAB;  Service: Cardiovascular;  Laterality: N/A;       No family history on file.  Social History   Tobacco Use   Smoking status: Never    Smokeless tobacco: Current    Types: Snuff    Last attempt to quit: 09/02/2012  Vaping Use   Vaping Use: Never used  Substance Use Topics   Alcohol use: Yes   Drug use: Never    Home Medications Prior to Admission medications   Medication Sig Start Date End Date Taking? Authorizing Provider  erythromycin ophthalmic ointment Place a 1/2 inch ribbon of ointment into the lower eyelid 4 times a day 09/15/21  Yes [name], DO  amoxicillin-clavulanate (AUGMENTIN) 875-125 MG tablet Take 1 tablet by mouth 2 (two) times daily. 12/10/16   [name], MD  Benzocaine 10 MG LOZG Use as directed 1 lozenge (10 mg total) in the mouth or throat 4 (four) times daily as needed. 12/10/16   [name], MD    Allergies    Patient has no known allergies.  Review of Systems   Review of Systems  Constitutional:  Negative for chills and fever.  HENT:  Negative for ear pain, sinus pressure, sinus pain and sore throat.   Eyes:  Positive for itching. Negative for blurred vision, double vision, pain, discharge, redness and visual disturbance.  Respiratory:  Negative for cough and shortness of breath.   Cardiovascular:  Negative for chest pain and palpitations.  Gastrointestinal:  Negative for abdominal pain, nausea and vomiting.  Genitourinary:  Negative for dysuria and hematuria.  Musculoskeletal:  Negative for arthralgias and back pain.  Skin:  Negative for color change and rash.  Neurological:  Negative for tingling,  seizures, syncope, weakness, numbness and headaches.  All other systems reviewed and are negative.  Physical Exam Updated Vital Signs BP 122/88   Pulse 94   Temp 98.1 F (36.7 C) (Oral)   Resp 16   Ht 5\' 10"  (1.778 m)   Wt 104.3 kg   SpO2 100%   BMI 33.00 kg/m   Physical Exam Constitutional:      General: He is not in acute distress.    Appearance: He is not ill-appearing.  HENT:     Head: Normocephalic and atraumatic.     Right Ear: Tympanic membrane  normal.     Left Ear: Tympanic membrane normal.  Eyes:     General:        Right eye: No discharge.        Left eye: Discharge present.    Extraocular Movements: Extraocular movements intact.     Pupils: Pupils are equal, round, and reactive to light.     Comments: Fluorescein uptake is negative in both eyes, ocular pressure is normal, conjunctiva is irritated, no obvious corneal abrasion or ulcer, visual acuity intact  Pulmonary:     Effort: Pulmonary effort is normal. No respiratory distress.     Breath sounds: No wheezing.  Musculoskeletal:     Cervical back: Normal range of motion.  Neurological:     Mental Status: He is alert.    ED Results / Procedures / Treatments   Labs (all labs ordered are listed, but only abnormal results are displayed) Labs Reviewed - No data to display  EKG None  Radiology No results found.  Procedures Procedures   Medications Ordered in ED Medications  erythromycin ophthalmic ointment (has no administration in time range)  fluorescein ophthalmic strip 1 strip (1 strip Both Eyes Given by Other 09/15/21 1759)  tetracaine (PONTOCAINE) 0.5 % ophthalmic solution 2 drop (2 drops Both Eyes Given by Other 09/15/21 1759)    ED Course  I have reviewed the triage vital signs and the nursing notes.  Pertinent labs & imaging results that were available during my care of the patient were reviewed by me and considered in my medical decision making (see chart for details).    MDM Rules/Calculators/A&P                           XAI FRERKING is here after chemical exposure to left eye.  Normal vitals.  No fever.  Was splashed in eye by chemical at work.  Had eye irrigation done prior to my evaluation.  pH in the eye is normal.  There is no obvious corneal ulcer or abrasion.  Left eye has some mild conjunctival irritation but otherwise visual acuity is normal.  Right eyes normal.  Does not have any pain in that eye.  No respiratory symptoms.  Overall  will prescribe erythromycin ointment.  Recommend continued flushing of the eyes.  Recommend follow-up with ophthalmology.  Discharged in good condition.  This chart was dictated using voice recognition software.  Despite best efforts to proofread,  errors can occur which can change the documentation meaning.   Final Clinical Impression(s) / ED Diagnoses Final diagnoses:  Chemical conjunctivitis of left eye    Rx / DC Orders ED Discharge Orders          Ordered    erythromycin ophthalmic ointment        09/15/21 1801             Steffon Gladu,  Dala Breault, DO 09/15/21 1805

## 2021-09-15 NOTE — ED Notes (Signed)
No acute distress noted upon this RN's departure of patient. Verified discharge paperwork with name and DOB. Vital signs stable. Patient taken to checkout window. Discharge paperwork discussed with patient and wife. No further questions voiced upon discharge.  ° °

## 2021-09-15 NOTE — Discharge Instructions (Addendum)
Take antibiotic ointment as prescribed.

## 2021-09-16 ENCOUNTER — Encounter (HOSPITAL_BASED_OUTPATIENT_CLINIC_OR_DEPARTMENT_OTHER): Payer: Self-pay | Admitting: Emergency Medicine

## 2021-09-16 ENCOUNTER — Emergency Department (HOSPITAL_BASED_OUTPATIENT_CLINIC_OR_DEPARTMENT_OTHER)
Admission: EM | Admit: 2021-09-16 | Discharge: 2021-09-16 | Disposition: A | Discharge status: Home / Self Care | Payer: Worker's Compensation | Attending: Emergency Medicine | Admitting: Emergency Medicine

## 2021-09-16 DIAGNOSIS — Y990 Civilian activity done for income or pay: Secondary | ICD-10-CM | POA: Diagnosis not present

## 2021-09-16 DIAGNOSIS — H019 Unspecified inflammation of eyelid: Secondary | ICD-10-CM | POA: Diagnosis not present

## 2021-09-16 DIAGNOSIS — T2692XA Corrosion of left eye and adnexa, part unspecified, initial encounter: Secondary | ICD-10-CM | POA: Insufficient documentation

## 2021-09-16 DIAGNOSIS — H5712 Ocular pain, left eye: Secondary | ICD-10-CM | POA: Diagnosis not present

## 2021-09-16 DIAGNOSIS — T5391XA Toxic effect of unspecified halogen derivatives of aliphatic and aromatic hydrocarbons, accidental (unintentional), initial encounter: Secondary | ICD-10-CM | POA: Insufficient documentation

## 2021-09-16 DIAGNOSIS — Y9269 Other specified industrial and construction area as the place of occurrence of the external cause: Secondary | ICD-10-CM | POA: Insufficient documentation

## 2021-09-16 DIAGNOSIS — X58XXXA Exposure to other specified factors, initial encounter: Secondary | ICD-10-CM | POA: Insufficient documentation

## 2021-09-16 MED ORDER — TOBRAMYCIN-DEXAMETHASONE 0.3-0.1 % OP SUSP
2.0000 [drp] | OPHTHALMIC | Status: DC
  Administered 2021-09-16: 2 [drp] via OPHTHALMIC
  Filled 2021-09-16: qty 2.5

## 2021-09-16 NOTE — ED Triage Notes (Signed)
Pt states he got a chemical in his left eye  Pt flushed his eye at work and was seen here for same  Pt states his eye is swollen and painful

## 2021-09-16 NOTE — ED Provider Notes (Signed)
[facility] Provider Note: Lowella Dell, MD, FACEP  CSN: [id number] MRN: [id number] ARRIVAL: 09/16/21 at 0128 ROOM: MH09/MH09   CHIEF COMPLAINT  Eye Pain   HISTORY OF PRESENT ILLNESS  09/16/21 1:38 AM Valdis NORMAND DAMRON is a 40 y.o. male who got an industrial chemical splashed in his left eye at work yesterday afternoon.  He was seen in this ED.  He had already had irrigation performed to the eyes.  pH was normal.  There were no corneal ulcerations or abrasions seen.  He was noted to have some mild left conjunctival irritation but had normal visual acuity.  The right eye was unremarkable.  He was placed on erythromycin ointment and discharged with ophthalmology follow-up.  He returns with increased swelling of his eyelids to the point that he can barely open his left eye.  His vision is more blurry than earlier but pain is not significantly changed.  He rates pain as a 5 out of 10, worse with exposure to light.  He is having difficulty placing the erythromycin ointment due to difficulty opening the eye.   Past Medical History:  Diagnosis Date   Acute pericarditis, unspecified 09/17/2012   Elevated blood pressure 09/17/2012   Elevated transaminase level/Chronic 09/17/2012   thes date back to age 55.    Left ventricular hypertrophy 09/17/2012   Obesity    on 09/18/12  BMI 35.6 weight 112.2 kg/247#   STEMI (ST elevation myocardial infarction) [facility])     Past Surgical History:  Procedure Laterality Date   CARDIAC CATHETERIZATION     left heart cath   LEFT HEART CATHETERIZATION WITH CORONARY ANGIOGRAM N/A 09/28/2012   Procedure: LEFT HEART CATHETERIZATION WITH CORONARY ANGIOGRAM;  Surgeon: Lennette Bihari, MD;  Location: [facility] CATH LAB;  Service: Cardiovascular;  Laterality: N/A;    History reviewed. No pertinent family history.  Social History   Tobacco Use   Smoking status: Never   Smokeless tobacco: Current    Types: Snuff    Last attempt to quit: 09/02/2012   Vaping Use   Vaping Use: Never used  Substance Use Topics   Alcohol use: Yes   Drug use: Never    Prior to Admission medications   Not on File    Allergies Patient has no known allergies.   REVIEW OF SYSTEMS  Negative except as noted here or in the History of Present Illness.   PHYSICAL EXAMINATION  Initial Vital Signs Blood pressure (!) 143/115, pulse 90, temperature 97.7 F (36.5 C), temperature source Oral, resp. rate 16, height 5\' 11"  (1.803 m), weight 106.6 kg, SpO2 97 %.  Examination General: Well-developed, well-nourished male in no acute distress; appearance consistent with age of record HENT: normocephalic; atraumatic Eyes: Right eye appears normal; edema of left eyelids with difficulty opening the eye, minimal conjunctival injection, no chemosis, no hyphema, photophobia Neck: supple Heart: regular rate and rhythm Lungs: clear to auscultation bilaterally Abdomen: soft; nondistended; nontender; bowel sounds present Extremities: No deformity; full range of motion Neurologic: Awake, alert and oriented; motor function intact in all extremities and symmetric; no facial droop Skin: Warm and dry Psychiatric: Flat affect   RESULTS  Summary of this visit's results, reviewed and interpreted by myself:   EKG Interpretation  Date/Time:    Ventricular Rate:    PR Interval:    QRS Duration:   QT Interval:    QTC Calculation:   R Axis:     Text Interpretation:  Laboratory Studies: No results found for this or any previous visit (from the past 24 hour(s)). Imaging Studies: No results found.  ED COURSE and MDM  Nursing notes, initial and subsequent vitals signs, including pulse oximetry, reviewed and interpreted by myself.  Vitals:   09/16/21 0133 09/16/21 0136  BP:  (!) 143/115  Pulse:  90  Resp:  16  Temp:  97.7 F (36.5 C)  TempSrc:  Oral  SpO2:  97%  Weight: 106.6 kg   Height: 5\' 11"  (1.803 m)    Medications  tobramycin-dexamethasone  (TOBRADEX) ophthalmic suspension 2 drop (2 drops Left Eye Given 09/16/21 0215)    The chemical the patient got in his eye was [facility] W, which contains aliphatic diisocyanate.  The MSDS indicates that for ocular exposure with inflammation a combination antibacterial and steroid medication should be used.  We will switch him from erythromycin ointment to TobraDex suspension.  He was given referral to ophthalmology and advised to contact the ophthalmologist later this morning for urgent follow-up.  PROCEDURES  Procedures   ED DIAGNOSES     ICD-10-CM   1. Chemical burn of left eye  T26.92XA          Akeelah Seppala, [facility], MD 09/16/21 [phone]

## 2024-02-09 ENCOUNTER — Other Ambulatory Visit: Payer: Self-pay

## 2024-02-09 ENCOUNTER — Emergency Department (HOSPITAL_BASED_OUTPATIENT_CLINIC_OR_DEPARTMENT_OTHER)

## 2024-02-09 ENCOUNTER — Emergency Department (HOSPITAL_BASED_OUTPATIENT_CLINIC_OR_DEPARTMENT_OTHER)
Admission: EM | Admit: 2024-02-09 | Discharge: 2024-02-09 | Disposition: A | Discharge status: Home / Self Care | Attending: Emergency Medicine | Admitting: Emergency Medicine

## 2024-02-09 ENCOUNTER — Encounter (HOSPITAL_BASED_OUTPATIENT_CLINIC_OR_DEPARTMENT_OTHER): Payer: Self-pay | Admitting: Emergency Medicine

## 2024-02-09 DIAGNOSIS — R0602 Shortness of breath: Secondary | ICD-10-CM | POA: Diagnosis not present

## 2024-02-09 DIAGNOSIS — R519 Headache, unspecified: Secondary | ICD-10-CM | POA: Diagnosis present

## 2024-02-09 DIAGNOSIS — I10 Essential (primary) hypertension: Secondary | ICD-10-CM | POA: Diagnosis not present

## 2024-02-09 LAB — CBC WITH DIFFERENTIAL/PLATELET
Abs Immature Granulocytes: 0.01 10*3/uL (ref 0.00–0.07)
Basophils Absolute: 0 10*3/uL (ref 0.0–0.1)
Basophils Relative: 0 %
Eosinophils Absolute: 0 10*3/uL (ref 0.0–0.5)
Eosinophils Relative: 0 %
HCT: 40 % (ref 39.0–52.0)
Hemoglobin: 13.9 g/dL (ref 13.0–17.0)
Immature Granulocytes: 0 %
Lymphocytes Relative: 27 %
Lymphs Abs: 1.5 10*3/uL (ref 0.7–4.0)
MCH: 30.7 pg (ref 26.0–34.0)
MCHC: 34.8 g/dL (ref 30.0–36.0)
MCV: 88.3 fL (ref 80.0–100.0)
Monocytes Absolute: 0.4 10*3/uL (ref 0.1–1.0)
Monocytes Relative: 6 %
Neutro Abs: 3.8 10*3/uL (ref 1.7–7.7)
Neutrophils Relative %: 67 %
Platelets: 182 10*3/uL (ref 150–400)
RBC: 4.53 MIL/uL (ref 4.22–5.81)
RDW: 12.2 % (ref 11.5–15.5)
WBC: 5.7 10*3/uL (ref 4.0–10.5)
nRBC: 0 % (ref 0.0–0.2)

## 2024-02-09 LAB — COMPREHENSIVE METABOLIC PANEL
ALT: 24 U/L (ref 0–44)
AST: 25 U/L (ref 15–41)
Albumin: 4.7 g/dL (ref 3.5–5.0)
Alkaline Phosphatase: 53 U/L (ref 38–126)
Anion gap: 9 (ref 5–15)
BUN: 16 mg/dL (ref 6–20)
CO2: 28 mmol/L (ref 22–32)
Calcium: 9.7 mg/dL (ref 8.9–10.3)
Chloride: 102 mmol/L (ref 98–111)
Creatinine, Ser: 1.24 mg/dL (ref 0.61–1.24)
GFR, Estimated: >60 mL/min (ref >60)
Glucose, Bld: 114 mg/dL — ABNORMAL HIGH (ref 70–99)
Potassium: 3.3 mmol/L — ABNORMAL LOW (ref 3.5–5.1)
Sodium: 139 mmol/L (ref 135–145)
Total Bilirubin: 1.1 mg/dL (ref 0.0–1.2)
Total Protein: 7.8 g/dL (ref 6.5–8.1)

## 2024-02-09 LAB — BRAIN NATRIURETIC PEPTIDE: B Natriuretic Peptide: 25.9 pg/mL (ref 0.0–100.0)

## 2024-02-09 LAB — TROPONIN I (HIGH SENSITIVITY)
Troponin I (High Sensitivity): 4 ng/L (ref <18)
Troponin I (High Sensitivity): 4 ng/L (ref <18)

## 2024-02-09 MED ORDER — POTASSIUM CHLORIDE CRYS ER 20 MEQ PO TBCR
20.0000 meq | EXTENDED_RELEASE_TABLET | Freq: Every day | ORAL | 0 refills | Status: DC
Start: 2024-02-09 — End: 2024-03-03

## 2024-02-09 MED ORDER — HYDROCHLOROTHIAZIDE 25 MG PO TABS: 25.0000 mg | ORAL_TABLET | Freq: Every day | ORAL | 0 refills | Status: DC

## 2024-02-09 MED ORDER — LABETALOL HCL 5 MG/ML IV SOLN
10.0000 mg | Freq: Once | INTRAVENOUS | Status: AC
  Administered 2024-02-09: 10 mg via INTRAVENOUS
  Filled 2024-02-09: qty 4

## 2024-02-09 MED ORDER — POTASSIUM CHLORIDE CRYS ER 20 MEQ PO TBCR
40.0000 meq | EXTENDED_RELEASE_TABLET | Freq: Once | ORAL | Status: AC
  Administered 2024-02-09: 40 meq via ORAL
  Filled 2024-02-09: qty 2

## 2024-02-09 MED ORDER — HYDROCHLOROTHIAZIDE 25 MG PO TABS
25.0000 mg | ORAL_TABLET | Freq: Once | ORAL | Status: AC
  Administered 2024-02-09: 25 mg via ORAL
  Filled 2024-02-09: qty 1

## 2024-02-09 NOTE — ED Triage Notes (Signed)
 States he feels like he is wearing a baseball cap on his head for 2 weeks and SOB since midnight

## 2024-02-09 NOTE — ED Notes (Signed)
 RT Note: Patient doesn't appear to be in any distress at this time. His breath sounds are clear

## 2024-02-09 NOTE — ED Provider Notes (Signed)
 [place] [facility] Provider Note   CSN: [id number] Arrival date & time: 02/09/24  0102     History  Chief Complaint  Patient presents with   Shortness of Breath    Michael [name] is a 43 y.o. male.  HPI     43yo male with history of pericarditis (2013-likely began as viral, then had hemopericardium in setting of lovenox use/pericardial effusion that later organized and had effusive/constrictive picture-saw Dr. Shirlee Latch), recent occupational health visit 1/30 found to have hypertension with outpatient follow up scheduled who presents with concern for headache, dyspnea, and hypertension.  Reports was working last night and began to feel a sensation "like a hat was on my head" -a constant head pressure, just not feeling right. For the past day or so has had strange sensation in left hand like a weakness but does not truly feel weak nor numb just not right off and on and difficult to describe.  No chest pain but does feel some slight shortness of breath. No leg pain or swelling, no cough or fever, no abdominal pain, nausea, vomiting.  Denies other numbness, weakness, change in vision, difficulty walking or talking.  Past Medical History:  Diagnosis Date   Acute pericarditis, unspecified 09/17/2012   Elevated blood pressure 09/17/2012   Elevated transaminase level/Chronic 09/17/2012   thes date back to age 64.    Left ventricular hypertrophy 09/17/2012   Obesity    on 09/18/12  BMI 35.6 weight 112.2 kg/247#   STEMI (ST elevation myocardial infarction) (HCC)     Home Medications Prior to Admission medications   Medication Sig Start Date End Date Taking? Authorizing Provider  hydrochlorothiazide (HYDRODIURIL) 25 MG tablet Take 1 tablet (25 mg total) by mouth daily. 02/09/24  Yes Alvira Monday, MD  potassium chloride SA (KLOR-CON M) 20 MEQ tablet Take 1 tablet (20 mEq total) by mouth daily for 2 days. 02/09/24 02/11/24 Yes Alvira Monday, MD       Allergies    Patient has no known allergies.    Review of Systems   Review of Systems  Physical Exam Updated Vital Signs BP (!) 188/120   Pulse 80   Temp 98.8 F (37.1 C) (Oral)   Resp 16   Ht 5\' 10"  (1.778 m)   Wt 102.1 kg   SpO2 100%   BMI 32.28 kg/m  Physical Exam Vitals and nursing note reviewed.  Constitutional:      General: He is not in acute distress.    Appearance: Normal appearance. He is well-developed. He is not ill-appearing or diaphoretic.  HENT:     Head: Normocephalic and atraumatic.  Eyes:     General: No visual field deficit.    Extraocular Movements: Extraocular movements intact.     Conjunctiva/sclera: Conjunctivae normal.     Pupils: Pupils are equal, round, and reactive to light.  Cardiovascular:     Rate and Rhythm: Normal rate and regular rhythm.     Pulses: Normal pulses.     Heart sounds: Normal heart sounds. No murmur heard.    No friction rub. No gallop.  Pulmonary:     Effort: Pulmonary effort is normal. No respiratory distress.     Breath sounds: Normal breath sounds. No wheezing or rales.  Abdominal:     General: There is no distension.     Palpations: Abdomen is soft.     Tenderness: There is no abdominal tenderness. There is no guarding.  Musculoskeletal:  General: No swelling or tenderness.     Cervical back: Normal range of motion.  Skin:    General: Skin is warm and dry.     Findings: No erythema or rash.  Neurological:     General: No focal deficit present.     Mental Status: He is alert and oriented to person, place, and time.     GCS: GCS eye subscore is 4. GCS verbal subscore is 5. GCS motor subscore is 6.     Cranial Nerves: No cranial nerve deficit, dysarthria or facial asymmetry.     Sensory: No sensory deficit.     Motor: No weakness or tremor.     Coordination: Coordination normal. Finger-Nose-Finger Test normal.     Gait: Gait normal.     ED Results / Procedures / Treatments   Labs (all labs ordered  are listed, but only abnormal results are displayed) Labs Reviewed  COMPREHENSIVE METABOLIC PANEL - Abnormal; Notable for the following components:      Result Value   Potassium 3.3 (*)    Glucose, Bld 114 (*)    All other components within normal limits  CBC WITH DIFFERENTIAL/PLATELET  BRAIN NATRIURETIC PEPTIDE  TROPONIN I (HIGH SENSITIVITY)  TROPONIN I (HIGH SENSITIVITY)    EKG EKG Interpretation Date/Time:  Sunday February 09 2024 07:18:53 EDT Ventricular Rate:  95 PR Interval:  202 QRS Duration:  87 QT Interval:  394 QTC Calculation: 496 R Axis:   36  Text Interpretation: Sinus rhythm Borderline prolonged PR interval Probable left atrial enlargement Borderline prolonged QT interval The diffuse ST elevation seen previously is now improved Confirmed by Alvira Monday ([id number]) on 02/09/2024 7:50:37 AM  Radiology DG Chest 2 View Result Date: 02/09/2024 CLINICAL DATA:  43 year old male with head pain and pressure for 2 weeks. Headache. Shortness of breath. EXAM: CHEST - 2 VIEW COMPARISON:  Chest radiographs 10/01/2012 and earlier. FINDINGS: PA and lateral view 0756 hours. Normal lung volumes and mediastinal contours. Visualized tracheal air column is within normal limits. Both lungs now appear clear. No pneumothorax or pleural effusion. No acute osseous abnormality identified. Negative visible bowel gas. IMPRESSION: Negative.  No acute cardiopulmonary abnormality. Electronically Signed   By: Odessa Fleming M.D.   On: 02/09/2024 08:07   CT Head Wo Contrast Result Date: 02/09/2024 CLINICAL DATA:  43 year old male with head pain and pressure for 2 weeks. Headache. Shortness of breath. EXAM: CT HEAD WITHOUT CONTRAST TECHNIQUE: Contiguous axial images were obtained from the base of the skull through the vertex without intravenous contrast. RADIATION DOSE REDUCTION: This exam was performed according to the departmental dose-optimization program which includes automated exposure control, adjustment of the  mA and/or kV according to patient size and/or use of iterative reconstruction technique. COMPARISON:  None Available. FINDINGS: Brain: Partially empty sella. Cerebral volume is at the lower limits of normal for age. No midline shift, ventriculomegaly, mass effect, evidence of mass lesion, intracranial hemorrhage or evidence of cortically based acute infarction. Mild scattered white matter hypodensity. Otherwise normal gray-white differentiation. Vascular: No suspicious intracranial vascular hyperdensity. Mild calcified atherosclerosis at the skull base. Skull: Intact, negative. Sinuses/Orbits: Visualized paranasal sinuses and mastoids are clear. Other: Visualized scalp soft tissues are within normal limits. Visualized orbit soft tissues are within normal limits. IMPRESSION: 1. Partially empty sella, often a normal anatomic variant but can be associated with idiopathic intracranial hypertension (pseudotumor cerebri). 2. Mild cerebral white matter changes, most commonly due to small vessel disease. 3. Mild but age advanced calcified atherosclerosis of  the ICA siphons. Electronically Signed   By: Odessa Fleming M.D.   On: 02/09/2024 08:06    Procedures Procedures    Medications Ordered in ED Medications  labetalol (NORMODYNE) injection 10 mg (10 mg Intravenous Given 02/09/24 0800)  potassium chloride SA (KLOR-CON M) CR tablet 40 mEq (40 mEq Oral Given 02/09/24 0916)  hydrochlorothiazide (HYDRODIURIL) tablet 25 mg (25 mg Oral Given 02/09/24 1610)    ED Course/ Medical Decision Making/ A&P                                  42yo male with history of pericarditis (2013-likely began as viral, then had hemopericardium in setting of lovenox use/pericardial effusion that later organized and had effusive/constrictive picture-saw Dr. Shirlee Latch), recent occupational health visit 1/30 found to have hypertension with outpatient follow up scheduled who presents with concern for headache, dyspnea, and hypertension.  Blood  pressures to 213/125 on arrival.   Given head pressure and different feeling in left hand with hypertension ordered CT head.  CT completed and personally evaluated and interpreted by me shows partially empty sella which may be a normal anatomic variant but could be associated with idiopathic intracranial hypertension, mild cerebral white matter changes, mild but age advanced calcified atherosclerosis.  His neurologic exam is normal and history is not consistent with CVA or TIA.  History overall does not suggest IIH as no hx of prior headaches.  Differential diagnosis for dyspnea includes hypertensive emergency, ACS, PE, COPD exacerbation, CHF exacerbation, anemia, pneumonia, viral etiology such as COVID 19 infection, metabolic abnormality, pericardial effusion.    EKG was evaluated by me which showed no acute abnormalities, improved prior diffuse ST elevations.    Chest x-ray personally evaluated by me and radiology which showed no acute abnormalities, no pulmonary edema or pneumonia.   Labs were personally evaluated interpreted by me show no anemia, no leukocytosis, electrolytes with mild hypokalemia, renal function within normal limits.  Troponin x2  within normal limits. BNP within normal limits.  Do not see signs of ACS, history and exam is not consistent with aortic dissection, no signs of ICH, CVA, pulmonary edema, hypertensive encephalopathy.  Has hypertension, but do not see signs of blood pressure emergency at this time  Given labetalol and hydrochlorothiazide with improvement of BP to 184/120.  Feeling improved.  Given rx for hydrochlorothiazide with discussion of reasons to return to the emergency department, need for close outpatient follow-up of blood pressures and recheck of labs and starting this medication.  Place TOC consult to find PCP (was also planning on [facility] with [facility] but will see if can get closer appointment) , is seeing urologist in 2 weeks.  Given K in ED and rx for  2 days.  Patient discharged in stable condition with understanding of reasons to return.          Final Clinical Impression(s) / ED Diagnoses Final diagnoses:  Essential hypertension  Acute nonintractable headache, unspecified headache type    Rx / DC Orders ED Discharge Orders          Ordered    hydrochlorothiazide (HYDRODIURIL) 25 MG tablet  Daily        02/09/24 0912    potassium chloride SA (KLOR-CON M) 20 MEQ tablet  Daily        02/09/24 0912              Alvira Monday, MD 02/09/24 1241

## 2024-02-09 NOTE — Care Management (Signed)
PCP on AVS 

## 2024-03-03 ENCOUNTER — Encounter: Payer: Self-pay | Admitting: Family

## 2024-03-03 ENCOUNTER — Ambulatory Visit (INDEPENDENT_AMBULATORY_CARE_PROVIDER_SITE_OTHER): Admitting: Family

## 2024-03-03 ENCOUNTER — Other Ambulatory Visit: Payer: Self-pay | Admitting: Family

## 2024-03-03 VITALS — BP 136/84 | HR 88 | Ht 70.0 in | Wt 221.0 lb

## 2024-03-03 DIAGNOSIS — R350 Frequency of micturition: Secondary | ICD-10-CM | POA: Diagnosis not present

## 2024-03-03 DIAGNOSIS — R7309 Other abnormal glucose: Secondary | ICD-10-CM | POA: Diagnosis not present

## 2024-03-03 DIAGNOSIS — I517 Cardiomegaly: Secondary | ICD-10-CM | POA: Diagnosis not present

## 2024-03-03 DIAGNOSIS — R930 Abnormal findings on diagnostic imaging of skull and head, not elsewhere classified: Secondary | ICD-10-CM

## 2024-03-03 DIAGNOSIS — I10 Essential (primary) hypertension: Secondary | ICD-10-CM

## 2024-03-03 DIAGNOSIS — I309 Acute pericarditis, unspecified: Secondary | ICD-10-CM | POA: Diagnosis not present

## 2024-03-03 DIAGNOSIS — R361 Hematospermia: Secondary | ICD-10-CM

## 2024-03-03 LAB — CBC WITH DIFFERENTIAL/PLATELET
Basophils Absolute: 0 10*3/uL (ref 0.0–0.1)
Basophils Relative: 0.4 % (ref 0.0–3.0)
Eosinophils Absolute: 0 10*3/uL (ref 0.0–0.7)
Eosinophils Relative: 0.6 % (ref 0.0–5.0)
HCT: 40.6 % (ref 39.0–52.0)
Hemoglobin: 13.8 g/dL (ref 13.0–17.0)
Lymphocytes Relative: 31.6 % (ref 12.0–46.0)
Lymphs Abs: 2 10*3/uL (ref 0.7–4.0)
MCHC: 33.9 g/dL (ref 30.0–36.0)
MCV: 91.6 fl (ref 78.0–100.0)
Monocytes Absolute: 0.4 10*3/uL (ref 0.1–1.0)
Monocytes Relative: 5.7 % (ref 3.0–12.0)
Neutro Abs: 3.9 10*3/uL (ref 1.4–7.7)
Neutrophils Relative %: 61.7 % (ref 43.0–77.0)
Platelets: 187 10*3/uL (ref 150.0–400.0)
RBC: 4.43 Mil/uL (ref 4.22–5.81)
RDW: 13.3 % (ref 11.5–15.5)
WBC: 6.4 10*3/uL (ref 4.0–10.5)

## 2024-03-03 LAB — COMPREHENSIVE METABOLIC PANEL WITH GFR
ALT: 23 U/L (ref 0–53)
AST: 24 U/L (ref 0–37)
Albumin: 5 g/dL (ref 3.5–5.2)
Alkaline Phosphatase: 57 U/L (ref 39–117)
BUN: 24 mg/dL — ABNORMAL HIGH (ref 6–23)
CO2: 28 meq/L (ref 19–32)
Calcium: 9.9 mg/dL (ref 8.4–10.5)
Chloride: 100 meq/L (ref 96–112)
Creatinine, Ser: 1.37 mg/dL (ref 0.40–1.50)
GFR: 63.19 mL/min (ref >60.00)
Glucose, Bld: 93 mg/dL (ref 70–99)
Potassium: 3.8 meq/L (ref 3.5–5.1)
Sodium: 139 meq/L (ref 135–145)
Total Bilirubin: 0.7 mg/dL (ref 0.2–1.2)
Total Protein: 7.4 g/dL (ref 6.0–8.3)

## 2024-03-03 LAB — HEMOGLOBIN A1C: Hgb A1c MFr Bld: 6 % (ref 4.6–6.5)

## 2024-03-03 LAB — PSA: PSA: 0.64 ng/mL (ref 0.10–4.00)

## 2024-03-03 MED ORDER — HYDROCHLOROTHIAZIDE 25 MG PO TABS: 25.0000 mg | ORAL_TABLET | Freq: Every day | ORAL | 0 refills | Status: DC

## 2024-03-03 NOTE — Progress Notes (Signed)
 Michael [name] is a 43 y.o. male with the following history as recorded in [employer]:  Patient Active Problem List   Diagnosis Date Noted   Acute pericarditis, unspecified 09/29/2012   Leukocytosis 09/29/2012   Hypertension 09/18/2012   Elevated transaminase level/Chronic 09/17/2012   Left ventricular hypertrophy 09/17/2012   Elevated blood pressure 09/17/2012   Obstructive sleep apnea 09/17/2012    Current Outpatient Medications  Medication Sig Dispense Refill   hydrochlorothiazide (HYDRODIURIL) 25 MG tablet Take 1 tablet (25 mg total) by mouth daily. 90 tablet 0   No current facility-administered medications for this visit.    Allergies: Patient has no known allergies.  Past Medical History:  Diagnosis Date   Acute pericarditis, unspecified 09/17/2012   Elevated blood pressure 09/17/2012   Elevated transaminase level/Chronic 09/17/2012   thes date back to age 19.    Left ventricular hypertrophy 09/17/2012   Obesity    on 09/18/12  BMI 35.6 weight 112.2 kg/247#   STEMI (ST elevation myocardial infarction) [facility])     Past Surgical History:  Procedure Laterality Date   CARDIAC CATHETERIZATION     left heart cath   LEFT HEART CATHETERIZATION WITH CORONARY ANGIOGRAM N/A 09/28/2012   Procedure: LEFT HEART CATHETERIZATION WITH CORONARY ANGIOGRAM;  Surgeon: Lennette Bihari, MD;  Location: [facility] CATH LAB;  Service: Cardiovascular;  Laterality: N/A;    No family history on file.  Social History   Tobacco Use   Smoking status: Never   Smokeless tobacco: Current    Types: Snuff    Last attempt to quit: 09/02/2012  Substance Use Topics   Alcohol use: Yes    Subjective:   Presents today as a new patient; accompanied by his girlfriend; was seen at ER early in March with hypertension; was started on hydrochlorothiazide; in reviewing notes, does have history of pericarditis- unclear as to follow up? Today complaining of "brain zap" sensation- had CT at ER;   Objective:  Vitals:    03/03/24 0904  BP: 136/84  Pulse: 88  SpO2: 97%  Weight: 221 lb (100.2 kg)  Height: 5\' 10"  (1.778 m)    General: Well developed, well nourished, in no acute distress  Skin : Warm and dry.  Head: Normocephalic and atraumatic  Eyes: Sclera and conjunctiva clear; pupils round and reactive to light; extraocular movements intact  Ears: External normal; canals clear; tympanic membranes normal  Oropharynx: Pink, supple. No suspicious lesions  Neck: Supple without thyromegaly, adenopathy  Lungs: Respirations unlabored; clear to auscultation bilaterally without wheeze, rales, rhonchi  CVS exam: normal rate and regular rhythm.  Abdomen: Soft; nontender; nondistended; normoactive bowel sounds; no masses or hepatosplenomegaly  Musculoskeletal: No deformities; no active joint inflammation  Extremities: No edema, cyanosis, clubbing  Vessels: Symmetric bilaterally  Neurologic: Alert and oriented; speech intact; face symmetrical; moves all extremities well; CNII-XII intact without focal deficit   Assessment:  1. Primary hypertension   2. Acute pericarditis, unspecified type   3. Left ventricular hypertrophy   4. Abnormal head CT   5. Elevated glucose   6. Urinary frequency     Plan:  Stay on hydrochlorothiazide for now- to consider changing to alternative treatment based on potassium level today; 2. & 3. Referral to cardiology; 4.   Referral to neurology; 5.   Check Hgba1c today;  6.   Check PSA today; patient was previously scheduled to see urology due to presence of blood in semen; he cancelled that appointment as his symptoms resolved once his blood pressure improved;  encouraged patient to re-consider seeing urology;   No follow-ups on file.  Orders Placed This Encounter  Procedures   Comp Met (CMET)   Hemoglobin A1c   PSA   CBC with Differential/Platelet   Ambulatory referral to Cardiology    Referral Priority:   Routine    Referral Type:   Consultation    Referral Reason:    Specialty Services Required    Number of Visits Requested:   1   Ambulatory referral to Neurology    Referral Priority:   Routine    Referral Type:   Consultation    Referral Reason:   Specialty Services Required    Requested Specialty:   Neurology    Number of Visits Requested:   1    Requested Prescriptions   Signed Prescriptions Disp Refills   hydrochlorothiazide (HYDRODIURIL) 25 MG tablet 90 tablet 0    Sig: Take 1 tablet (25 mg total) by mouth daily.

## 2024-03-24 NOTE — Progress Notes (Unsigned)
 [facility]  PATIENT: Michael [name] DOB: 05-Dec-1980  REFERRING DOCTOR OR PCP:  Glade Lambert, FNP SOURCE: Patient, notes from emergency room, imaging and lab reports, CT scan personally reviewed.  _________________________________   HISTORICAL  CHIEF COMPLAINT:  Chief Complaint  Patient presents with   New Patient (Initial Visit)    Pt in 10 alone Pt states here for increased headaches . Pt states since starting new BP medication headaches had subsided Pt states headache pain was wrapped around head like a crown      HISTORY OF PRESENT ILLNESS:  I had the pleasure of seeing your patient, Michael [name], at [facility] for neurologic consultation regarding his headaches and abnormal CT scan  He is a 43 year old man with hypertension, coronary artery disease, LVH, pericarditis (2013) who is reporting headaches.  Recent CT scan 02/09/2024 showed a partially empty sella.  There were also subtle white matter changes.  He has had a pressure headache sensation the  past few months, like wearing a tight hat.  Pain was much more intense last month but is milder now and more intermittent.   Of note when pain was more severe he had BP 213/125 when he presented to the ED.   CT scan in ED showed a partially empty sella.   He feels symptoms improved when he started hydrochlorothiazide    BP is 148/93  He denies visual changes.   No change in gait, balance, strength or sensation.  His last ophthalmology appt was about one year ago and was fine.  Imaging: CT scan of the head 02/09/2024 showed normal brain volume.  Subtle white matter changes, most likely consistent with chronic microvascular ischemic change were seen.  Partially empty sella.  No acute findings.  REVIEW OF SYSTEMS: Constitutional: No fevers, chills, sweats, or change in appetite Eyes: No visual changes, double vision, eye pain Ear, nose and throat: No hearing loss, ear pain, nasal congestion,  sore throat Cardiovascular: No chest pain, palpitations Respiratory:  No shortness of breath at rest or with exertion.   No wheezes GastrointestinaI: No nausea, vomiting, diarrhea, abdominal pain, fecal incontinence Genitourinary:  No dysuria, urinary retention or frequency.  No nocturia. Musculoskeletal:  No neck pain, back pain Integumentary: No rash, pruritus, skin lesions Neurological: as above Psychiatric: No depression at this time.  No anxiety Endocrine: No palpitations, diaphoresis, change in appetite, change in weigh or increased thirst Hematologic/Lymphatic:  No anemia, purpura, petechiae. Allergic/Immunologic: No itchy/runny eyes, nasal congestion, recent allergic reactions, rashes  ALLERGIES: No Known Allergies  HOME MEDICATIONS:  Current Outpatient Medications:    hydrochlorothiazide  (HYDRODIURIL ) 25 MG tablet, Take 1 tablet (25 mg total) by mouth daily., Disp: 90 tablet, Rfl: 0  PAST MEDICAL HISTORY: Past Medical History:  Diagnosis Date   Acute pericarditis, unspecified 09/17/2012   Elevated blood pressure 09/17/2012   Elevated transaminase level/Chronic 09/17/2012   thes date back to age 25.    Left ventricular hypertrophy 09/17/2012   Obesity    on 09/18/12  BMI 35.6 weight 112.2 kg/247#   STEMI (ST elevation myocardial infarction) (HCC)     PAST SURGICAL HISTORY: Past Surgical History:  Procedure Laterality Date   CARDIAC CATHETERIZATION     left heart cath   LEFT HEART CATHETERIZATION WITH CORONARY ANGIOGRAM N/A 09/28/2012   Procedure: LEFT HEART CATHETERIZATION WITH CORONARY ANGIOGRAM;  Surgeon: Millicent Ally, MD;  Location: [facility] CATH LAB;  Service: Cardiovascular;  Laterality: N/A;    FAMILY HISTORY: Family History  Problem Relation  Age of Onset   Migraines Neg Hx     SOCIAL HISTORY: Social History   Socioeconomic History   Marital status: Married    Spouse name: Not on file   Number of children: Not on file   Years of education: Not on file    Highest education level: Not on file  Occupational History   Not on file  Tobacco Use   Smoking status: Never   Smokeless tobacco: Current    Types: Snuff    Last attempt to quit: 09/02/2012  Vaping Use   Vaping status: Never Used  Substance and Sexual Activity   Alcohol use: Yes    Alcohol/week: 14.0 standard drinks of alcohol    Types: 14 Cans of beer per week   Drug use: Never   Sexual activity: Not Currently  Other Topics Concern   Not on file  Social History Narrative   Pt lives with family    Pt works    Social Drivers of Corporate investment banker Strain: Not on Ship broker Insecurity: Not on file  Transportation Needs: Not on file  Physical Activity: Not on file  Stress: Not on file  Social Connections: Not on file  Intimate Partner Violence: Not on file       PHYSICAL EXAM  Vitals:   03/25/24 1016  BP: (!) 148/93  Pulse: 92  Weight: 223 lb (101.2 kg)  Height: 5\' 10"  (1.778 m)    Body mass index is 32 kg/m.   General: The patient is well-developed and well-nourished and in no acute distress.  Pharynx is Mallampati 2  HEENT:  Head is [place]/AT.  Sclera are anicteric.  Funduscopic exam shows normal optic discs and retinal vessels.  Neck: No carotid bruits are noted.  The neck is nontender.  Cardiovascular: The heart has a regular rate and rhythm with a normal S1 and S2. There were no murmurs, gallops or rubs.    Skin: Extremities are without rash or  edema.  Musculoskeletal:  Back is nontender  Neurologic Exam  Mental status: The patient is alert and oriented x 3 at the time of the examination. The patient has apparent normal recent and remote memory, with an apparently normal attention span and concentration ability.   Speech is normal.  Cranial nerves: Extraocular movements are full. Pupils are equal, round, and reactive to light and accomodation.  Visual fields are full.  Facial symmetry is present. There is good facial sensation to soft touch  bilaterally.Facial strength is normal.  Trapezius and sternocleidomastoid strength is normal. No dysarthria is noted.  The tongue is midline, and the patient has symmetric elevation of the soft palate. No obvious hearing deficits are noted.  Motor:  Muscle bulk is normal.   Tone is normal. Strength is  5 / 5 in all 4 extremities.   Sensory: Sensory testing is intact to pinprick, soft touch and vibration sensation in all 4 extremities.  Coordination: Cerebellar testing reveals good finger-nose-finger and heel-to-shin bilaterally.  Gait and station: Station is normal.   Gait is normal. Tandem gait is normal. Romberg is negative.   Reflexes: Deep tendon reflexes are symmetric and normal bilaterally.        DIAGNOSTIC DATA (LABS, IMAGING, TESTING) - I reviewed patient records, labs, notes, testing and imaging myself where available.  Lab Results  Component Value Date   WBC 6.4 03/03/2024   HGB 13.8 03/03/2024   HCT 40.6 03/03/2024   MCV 91.6 03/03/2024   PLT 187.0  03/03/2024      Component Value Date/Time   NA 139 03/03/2024 0957   K 3.8 03/03/2024 0957   CL 100 03/03/2024 0957   CO2 28 03/03/2024 0957   GLUCOSE 93 03/03/2024 0957   BUN 24 (H) 03/03/2024 0957   CREATININE 1.37 03/03/2024 0957   CREATININE 1.00 10/29/2012 1559   CALCIUM 9.9 03/03/2024 0957   PROT 7.4 03/03/2024 0957   ALBUMIN 5.0 03/03/2024 0957   AST 24 03/03/2024 0957   ALT 23 03/03/2024 0957   ALKPHOS 57 03/03/2024 0957   BILITOT 0.7 03/03/2024 0957   GFRNONAA >60 02/09/2024 0739   GFRAA >90 09/30/2012 0625   No results found for: "CHOL", "HDL", "LDLCALC", "LDLDIRECT", "TRIG", "CHOLHDL" Lab Results  Component Value Date   HGBA1C 6.0 03/03/2024   No results found for: "VITAMINB12" Lab Results  Component Value Date   TSH 0.698 10/29/2012       ASSESSMENT AND PLAN  Other headache syndrome  Essential hypertension  Empty sella (HCC)   In summary, Mr. Kendall is a 43 year old man who  presented with severe headaches associated with hypertensive urgency with a blood pressure of 225/120.  Headaches improved after the blood pressure was better controlled.  He currently continues to have mild elevated blood pressure and is advised to continue to follow-up with primary care to get this under the best control.  Current headaches are minimal and intermittent.  On his exam today, he did not have any evidence of elevated intracranial pressure.  Specifically, optic nerves appeared normal in both eyes.  Therefore, I believe that the partially empty sella seen on the CT scan is an incidental finding rather than a pathologic finding.  Current headaches are minimal and do not need to be treated or could be treated with OTC meds.    A follow-up was not scheduled.  However, he is advised to call us  if he has new or worsening neurologic symptoms.  If headaches become more regular or if he notes visual field changes then I would recommend a lumbar puncture to evaluate cerebrospinal fluid pressure  Thank you for asking me to see Mr. Frosty Jews.  Please let me know if I can be of further assistance with him or other patients in the future.   Anastaisa Wooding A. Godwin Lat, MD, [facility] 03/25/2024, 11:23 AM Certified in Neurology, Clinical Neurophysiology, Sleep Medicine and Neuroimaging  [facility] [address] [place], [place] [address] [phone]

## 2024-03-25 ENCOUNTER — Encounter: Payer: Self-pay | Admitting: Neurology

## 2024-03-25 ENCOUNTER — Ambulatory Visit (INDEPENDENT_AMBULATORY_CARE_PROVIDER_SITE_OTHER): Admitting: Neurology

## 2024-03-25 VITALS — BP 148/93 | HR 92 | Ht 70.0 in | Wt 223.0 lb

## 2024-03-25 DIAGNOSIS — I10 Essential (primary) hypertension: Secondary | ICD-10-CM

## 2024-03-25 DIAGNOSIS — G4489 Other headache syndrome: Secondary | ICD-10-CM

## 2024-03-25 DIAGNOSIS — E236 Other disorders of pituitary gland: Secondary | ICD-10-CM | POA: Diagnosis not present

## 2024-04-01 ENCOUNTER — Ambulatory Visit: Admitting: Urology

## 2024-04-16 ENCOUNTER — Ambulatory Visit

## 2024-06-07 ENCOUNTER — Other Ambulatory Visit: Payer: Self-pay | Admitting: Family

## 2024-06-09 ENCOUNTER — Ambulatory Visit

## 2024-07-21 ENCOUNTER — Other Ambulatory Visit: Payer: Self-pay | Admitting: Family

## 2024-07-21 NOTE — Telephone Encounter (Signed)
 Copied from CRM #[id number]. Topic: Clinical - Medication Refill >> Jul 21, 2024  9:15 AM Turkey A wrote: Medication: hydrochlorothiazide  (HYDRODIURIL ) 25 MG tablet  Has the patient contacted their pharmacy? Yes (Agent: If no, request that the patient contact the pharmacy for the refill. If patient does not wish to contact the pharmacy document the reason why and proceed with request.) (Agent: If yes, when and what did the pharmacy advise?)  This is the patient's preferred pharmacy:  [facility] [address] [place] - [address] [place] [address] Phone: [phone] Fax: [phone]  Is this the correct pharmacy for this prescription? Yes If no, delete pharmacy and type the correct one.   Has the prescription been filled recently? No  Is the patient out of the medication? Yes  Has the patient been seen for an appointment in the last year OR does the patient have an upcoming appointment? Yes  Can we respond through MyChart? No  Agent: Please be advised that Rx refills may take up to 3 business days. We ask that you follow-up with your pharmacy.

## 2024-07-24 ENCOUNTER — Ambulatory Visit: Admitting: Family

## 2024-07-28 ENCOUNTER — Ambulatory Visit (INDEPENDENT_AMBULATORY_CARE_PROVIDER_SITE_OTHER): Admitting: Family

## 2024-07-28 ENCOUNTER — Encounter: Payer: Self-pay | Admitting: Family

## 2024-07-28 VITALS — BP 140/94 | HR 90 | Ht 70.0 in | Wt 226.2 lb

## 2024-07-28 DIAGNOSIS — R7303 Prediabetes: Secondary | ICD-10-CM

## 2024-07-28 DIAGNOSIS — I10 Essential (primary) hypertension: Secondary | ICD-10-CM

## 2024-07-28 MED ORDER — VALSARTAN 160 MG PO TABS: 160.0000 mg | ORAL_TABLET | Freq: Every day | ORAL | 0 refills | Status: DC

## 2024-07-28 NOTE — Progress Notes (Signed)
 Michael [name] is a 43 y.o. male with the following history as recorded in EpicCare:  Patient Active Problem List   Diagnosis Date Noted   Acute pericarditis, unspecified 09/29/2012   Leukocytosis 09/29/2012   Hypertension 09/18/2012   Elevated transaminase level/Chronic 09/17/2012   Left ventricular hypertrophy 09/17/2012   Elevated blood pressure 09/17/2012   Obstructive sleep apnea 09/17/2012    Current Outpatient Medications  Medication Sig Dispense Refill   valsartan  (DIOVAN ) 160 MG tablet Take 1 tablet (160 mg total) by mouth daily. 90 tablet 0   No current facility-administered medications for this visit.    Allergies: Patient has no known allergies.  Past Medical History:  Diagnosis Date   Acute pericarditis, unspecified 09/17/2012   Elevated blood pressure 09/17/2012   Elevated transaminase level/Chronic 09/17/2012   thes date back to age 72.    Left ventricular hypertrophy 09/17/2012   Obesity    on 09/18/12  BMI 35.6 weight 112.2 kg/247#   STEMI (ST elevation myocardial infarction) [facility])     Past Surgical History:  Procedure Laterality Date   CARDIAC CATHETERIZATION     left heart cath   LEFT HEART CATHETERIZATION WITH CORONARY ANGIOGRAM N/A 09/28/2012   Procedure: LEFT HEART CATHETERIZATION WITH CORONARY ANGIOGRAM;  Surgeon: Debby DELENA Sor, MD;  Location: [facility] CATH LAB;  Service: Cardiovascular;  Laterality: N/A;    Family History  Problem Relation Age of Onset   Migraines Neg Hx     Social History   Tobacco Use   Smoking status: Never   Smokeless tobacco: Current    Types: Snuff    Last attempt to quit: 09/02/2012  Substance Use Topics   Alcohol use: Yes    Alcohol/week: 14.0 standard drinks of alcohol    Types: 14 Cans of beer per week    Subjective:   Follow up on hypertension; last seen here in April 2025; currently taking hydrochlorothiazide  25 mg daily; Denies any chest pain, shortness of breath, blurred vision or headache; is having increased  urination with the hydrochlorothiazide - does have to get up at least once per night;    Objective:  Vitals:   07/28/24 1340 07/28/24 1623  BP: (!) 140/98 (!) 140/94  Pulse: 90   SpO2: 96%   Weight: 226 lb 3.2 oz (102.6 kg)   Height: 5' 10 (1.778 m)     General: Well developed, well nourished, in no acute distress  Skin : Warm and dry.  Head: Normocephalic and atraumatic  Lungs: Respirations unlabored; clear to auscultation bilaterally without wheeze, rales, rhonchi  CVS exam: normal rate and regular rhythm.  Neurologic: Alert and oriented; speech intact; face symmetrical; moves all extremities well; CNII-XII intact without focal deficit   Assessment:  1. Primary hypertension   2. Pre-diabetes     Plan:  Will d/c hydrochlorothiazide  due to urinary side effects; change to Valsartan  160 mg; DASH diet discussed; follow up in 2 weeks; Check CMP, Hgba1c; patient defers meeting with nutritionist at this time.   I personally spent a total of 30 minutes in the care of the patient today including performing a medically appropriate exam/evaluation and counseling and educating.   Return in about 2 weeks (around 08/11/2024).  Orders Placed This Encounter  Procedures   Comp Met (CMET)   Hemoglobin A1c   CBC with Differential/Platelet    Requested Prescriptions   Signed Prescriptions Disp Refills   valsartan  (DIOVAN ) 160 MG tablet 90 tablet 0    Sig: Take 1 tablet (160 mg total)  by mouth daily.

## 2024-07-29 ENCOUNTER — Ambulatory Visit: Payer: Self-pay | Admitting: Family

## 2024-07-29 LAB — COMPREHENSIVE METABOLIC PANEL WITH GFR
ALT: 31 U/L (ref 0–53)
AST: 34 U/L (ref 0–37)
Albumin: 4.4 g/dL (ref 3.5–5.2)
Alkaline Phosphatase: 52 U/L (ref 39–117)
BUN: 14 mg/dL (ref 6–23)
CO2: 26 meq/L (ref 19–32)
Calcium: 9.1 mg/dL (ref 8.4–10.5)
Chloride: 102 meq/L (ref 96–112)
Creatinine, Ser: 1.28 mg/dL (ref 0.40–1.50)
GFR: 68.36 mL/min (ref >60.00)
Glucose, Bld: 100 mg/dL — ABNORMAL HIGH (ref 70–99)
Potassium: 3.6 meq/L (ref 3.5–5.1)
Sodium: 140 meq/L (ref 135–145)
Total Bilirubin: 0.4 mg/dL (ref 0.2–1.2)
Total Protein: 7.1 g/dL (ref 6.0–8.3)

## 2024-07-29 LAB — CBC WITH DIFFERENTIAL/PLATELET
Basophils Absolute: 0 K/uL (ref 0.0–0.1)
Basophils Relative: 0.8 % (ref 0.0–3.0)
Eosinophils Absolute: 0.1 K/uL (ref 0.0–0.7)
Eosinophils Relative: 0.9 % (ref 0.0–5.0)
HCT: 40.3 % (ref 39.0–52.0)
Hemoglobin: 13.5 g/dL (ref 13.0–17.0)
Lymphocytes Relative: 46 % (ref 12.0–46.0)
Lymphs Abs: 2.7 K/uL (ref 0.7–4.0)
MCHC: 33.5 g/dL (ref 30.0–36.0)
MCV: 94 fl (ref 78.0–100.0)
Monocytes Absolute: 0.3 K/uL (ref 0.1–1.0)
Monocytes Relative: 5.6 % (ref 3.0–12.0)
Neutro Abs: 2.7 K/uL (ref 1.4–7.7)
Neutrophils Relative %: 46.7 % (ref 43.0–77.0)
Platelets: 198 K/uL (ref 150.0–400.0)
RBC: 4.29 Mil/uL (ref 4.22–5.81)
RDW: 12.8 % (ref 11.5–15.5)
WBC: 5.8 K/uL (ref 4.0–10.5)

## 2024-07-29 LAB — HEMOGLOBIN A1C: Hgb A1c MFr Bld: 6.1 % (ref 4.6–6.5)

## 2024-08-11 ENCOUNTER — Ambulatory Visit (INDEPENDENT_AMBULATORY_CARE_PROVIDER_SITE_OTHER): Admitting: Family

## 2024-08-11 ENCOUNTER — Encounter: Payer: Self-pay | Admitting: Family

## 2024-08-11 VITALS — BP 132/76 | HR 84 | Ht 70.0 in | Wt 226.4 lb

## 2024-08-11 DIAGNOSIS — I10 Essential (primary) hypertension: Secondary | ICD-10-CM | POA: Diagnosis not present

## 2024-08-11 MED ORDER — VALSARTAN 160 MG PO TABS: 160.0000 mg | ORAL_TABLET | Freq: Every day | ORAL | 0 refills | Status: AC

## 2024-08-11 NOTE — Progress Notes (Signed)
  Michael [name] is a 43 y.o. male with the following history as recorded in EpicCare:  Patient Active Problem List   Diagnosis Date Noted   Acute pericarditis, unspecified 09/29/2012   Leukocytosis 09/29/2012   Hypertension 09/18/2012   Elevated transaminase level/Chronic 09/17/2012   Left ventricular hypertrophy 09/17/2012   Elevated blood pressure 09/17/2012   Obstructive sleep apnea 09/17/2012    Current Outpatient Medications  Medication Sig Dispense Refill   valsartan  (DIOVAN ) 160 MG tablet Take 1 tablet (160 mg total) by mouth daily. 90 tablet 0   No current facility-administered medications for this visit.    Allergies: Patient has no known allergies.  Past Medical History:  Diagnosis Date   Acute pericarditis, unspecified 09/17/2012   Elevated blood pressure 09/17/2012   Elevated transaminase level/Chronic 09/17/2012   thes date back to age 15.    Left ventricular hypertrophy 09/17/2012   Obesity    on 09/18/12  BMI 35.6 weight 112.2 kg/247#   STEMI (ST elevation myocardial infarction) [facility])     Past Surgical History:  Procedure Laterality Date   CARDIAC CATHETERIZATION     left heart cath   LEFT HEART CATHETERIZATION WITH CORONARY ANGIOGRAM N/A 09/28/2012   Procedure: LEFT HEART CATHETERIZATION WITH CORONARY ANGIOGRAM;  Surgeon: Debby DELENA Sor, MD;  Location: [facility] CATH LAB;  Service: Cardiovascular;  Laterality: N/A;    Family History  Problem Relation Age of Onset   Migraines Neg Hx     Social History   Tobacco Use   Smoking status: Never   Smokeless tobacco: Current    Types: Snuff    Last attempt to quit: 09/02/2012  Substance Use Topics   Alcohol use: Yes    Alcohol/week: 14.0 standard drinks of alcohol    Types: 14 Cans of beer per week    Subjective:   2 week follow up on HTN- at last OV, hydrochlorothiazide  was discontinued due to urinary side effects; has been feeling better since making the change; Denies any chest pain, shortness of breath,  blurred vision or headache   Objective:  Vitals:   08/11/24 1052  BP: 132/76  Pulse: 84  SpO2: 96%  Weight: 226 lb 6.4 oz (102.7 kg)  Height: 5' 10 (1.778 m)    General: Well developed, well nourished, in no acute distress  Skin : Warm and dry.  Head: Normocephalic and atraumatic  Eyes: Sclera and conjunctiva clear; pupils round and reactive to light; extraocular movements intact  Ears: External normal; canals clear; tympanic membranes normal  Oropharynx: Pink, supple. No suspicious lesions  Neck: Supple without thyromegaly, adenopathy  Lungs: Respirations unlabored; clear to auscultation bilaterally without wheeze, rales, rhonchi  CVS exam: normal rate and regular rhythm.  Neurologic: Alert and oriented; speech intact; face symmetrical; moves all extremities well; CNII-XII intact without focal deficit   Assessment:  1. Primary hypertension     Plan:  Good response to Valsartan ; feeling better off hydrochlorothiazide ; continue and prescription updated; follow up in 3 months with new PCP;   Return in about 3 months (around 11/10/2024) for TOC with Jessica.  No orders of the defined types were placed in this encounter.   Requested Prescriptions   Signed Prescriptions Disp Refills   valsartan  (DIOVAN ) 160 MG tablet 90 tablet 0    Sig: Take 1 tablet (160 mg total) by mouth daily.

## 2024-08-24 ENCOUNTER — Ambulatory Visit

## 2024-11-09 DIAGNOSIS — R7303 Prediabetes: Secondary | ICD-10-CM | POA: Insufficient documentation

## 2024-11-09 NOTE — Progress Notes (Unsigned)
 Subjective:     Patient ID: Michael [name], male    DOB: 04-24-1981, 43 y.o.   MRN: [id number]  No chief complaint on file.   HPI  Discussed the use of AI scribe software for clinical note transcription with the patient, who gave verbal consent to proceed.  History of Present Illness      Per cardiology 07/2013 advised to be on ASA-taking? Why stop?  HTN- on valsartan  BP at home  HLD- not currenlty on statin, no recent lipid levels   OSA? Cpap?         Patient denies fever, chills, SOB, CP, palpitations, dyspnea, edema, HA, vision changes, N/V/D, abdominal pain, urinary symptoms, rash, weight changes, and recent illness or hospitalizations.    Health Maintenance Due  Topic Date Due   Hepatitis B Vaccines 19-59 Average Risk (1 of 3 - 19+ 3-dose series) Never done   HPV VACCINES (1 - 3-dose SCDM series) Never done   DTaP/Tdap/Td (2 - Td or Tdap) 03/04/2024   Influenza Vaccine  Never done   COVID-19 Vaccine (3 - 2025-26 season) 08/03/2024    Past Medical History:  Diagnosis Date   Acute pericarditis, unspecified 09/17/2012   Elevated blood pressure 09/17/2012   Elevated transaminase level/Chronic 09/17/2012   thes date back to age 48.    Left ventricular hypertrophy 09/17/2012   Obesity    on 09/18/12  BMI 35.6 weight 112.2 kg/247#   STEMI (ST elevation myocardial infarction) [facility])     Past Surgical History:  Procedure Laterality Date   CARDIAC CATHETERIZATION     left heart cath   LEFT HEART CATHETERIZATION WITH CORONARY ANGIOGRAM N/A 09/28/2012   Procedure: LEFT HEART CATHETERIZATION WITH CORONARY ANGIOGRAM;  Surgeon: Debby DELENA Sor, MD;  Location: [facility] CATH LAB;  Service: Cardiovascular;  Laterality: N/A;    Family History  Problem Relation Age of Onset   Migraines Neg Hx     Social History   Socioeconomic History   Marital status: Married    Spouse name: Not on file   Number of children: Not on file   Years of education: Not on file   Highest  education level: Not on file  Occupational History   Not on file  Tobacco Use   Smoking status: Never   Smokeless tobacco: Current    Types: Snuff    Last attempt to quit: 09/02/2012  Vaping Use   Vaping status: Never Used  Substance and Sexual Activity   Alcohol use: Yes    Alcohol/week: 14.0 standard drinks of alcohol    Types: 14 Cans of beer per week   Drug use: Never   Sexual activity: Not Currently  Other Topics Concern   Not on file  Social History Narrative   Pt lives with family    Pt works    Social Drivers of Corporate Investment Banker Strain: Not on Ship Broker Insecurity: Not on file  Transportation Needs: Not on file  Physical Activity: Not on file  Stress: Not on file  Social Connections: Not on file  Intimate Partner Violence: Not on file    Outpatient Medications Prior to Visit  Medication Sig Dispense Refill   valsartan  (DIOVAN ) 160 MG tablet Take 1 tablet (160 mg total) by mouth daily. 90 tablet 0   No facility-administered medications prior to visit.    No Known Allergies  ROS     Objective:    Physical Exam Vitals reviewed.  Constitutional:  General: He is not in acute distress.    Appearance: He is not toxic-appearing.  HENT:     Head: Normocephalic and atraumatic.     Mouth/Throat:     Mouth: Mucous membranes are moist.     Pharynx: Oropharynx is clear.  Eyes:     Pupils: Pupils are equal, round, and reactive to light.  Cardiovascular:     Rate and Rhythm: Normal rate and regular rhythm.     Pulses: Normal pulses.     Heart sounds: Normal heart sounds. No murmur heard. Pulmonary:     Effort: Pulmonary effort is normal. No respiratory distress.     Breath sounds: Normal breath sounds. No wheezing.  Musculoskeletal:        General: No swelling.     Cervical back: Neck supple.  Skin:    General: Skin is warm and dry.  Neurological:     General: No focal deficit present.     Mental Status: He is alert and oriented to  person, place, and time.  Psychiatric:        Mood and Affect: Mood normal.        Behavior: Behavior normal.        Thought Content: Thought content normal.        Judgment: Judgment normal.      There were no vitals taken for this visit. Wt Readings from Last 3 Encounters:  08/11/24 226 lb 6.4 oz (102.7 kg)  07/28/24 226 lb 3.2 oz (102.6 kg)  03/25/24 223 lb (101.2 kg)       Assessment & Plan:   Problem List Items Addressed This Visit     Hypertension - Primary   Obstructive sleep apnea   Pre-diabetes    I am having Koby L. Duley maintain his valsartan .  No orders of the defined types were placed in this encounter.

## 2024-11-11 ENCOUNTER — Ambulatory Visit: Admitting: Student

## 2024-11-11 ENCOUNTER — Encounter: Payer: Self-pay | Admitting: Student

## 2024-11-11 DIAGNOSIS — G4733 Obstructive sleep apnea (adult) (pediatric): Secondary | ICD-10-CM

## 2024-11-11 DIAGNOSIS — R7303 Prediabetes: Secondary | ICD-10-CM

## 2024-11-11 DIAGNOSIS — I10 Essential (primary) hypertension: Secondary | ICD-10-CM

## 2024-11-11 DIAGNOSIS — Z91199 Patient's noncompliance with other medical treatment and regimen due to unspecified reason: Secondary | ICD-10-CM
